# Patient Record
Sex: Male | Born: 2000 | Race: White | Hispanic: No | Marital: Single | Smoking: Never smoker
Health system: Southern US, Community
[De-identification: ages and names within clinical notes are randomized; demographics above are authoritative.]

---
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
## 2019-03-21 DIAGNOSIS — Z79899 Other long term (current) drug therapy: Secondary | ICD-10-CM | POA: Diagnosis not present

## 2019-03-21 DIAGNOSIS — Z713 Dietary counseling and surveillance: Secondary | ICD-10-CM | POA: Diagnosis not present

## 2019-03-21 DIAGNOSIS — Z0000 Encounter for general adult medical examination without abnormal findings: Secondary | ICD-10-CM | POA: Diagnosis not present

## 2019-03-21 DIAGNOSIS — Z7182 Exercise counseling: Secondary | ICD-10-CM | POA: Diagnosis not present

## 2019-03-21 DIAGNOSIS — F909 Attention-deficit hyperactivity disorder, unspecified type: Secondary | ICD-10-CM | POA: Diagnosis not present

## 2019-03-21 DIAGNOSIS — Z68.41 Body mass index (BMI) pediatric, 5th percentile to less than 85th percentile for age: Secondary | ICD-10-CM | POA: Diagnosis not present

## 2019-06-05 DIAGNOSIS — S9031XA Contusion of right foot, initial encounter: Secondary | ICD-10-CM | POA: Diagnosis not present

## 2019-09-19 DIAGNOSIS — Z79899 Other long term (current) drug therapy: Secondary | ICD-10-CM | POA: Diagnosis not present

## 2019-09-19 DIAGNOSIS — F988 Other specified behavioral and emotional disorders with onset usually occurring in childhood and adolescence: Secondary | ICD-10-CM | POA: Diagnosis not present

## 2021-09-22 ENCOUNTER — Other Ambulatory Visit: Payer: Self-pay

## 2021-09-22 ENCOUNTER — Ambulatory Visit
Admission: EM | Admit: 2021-09-22 | Discharge: 2021-09-22 | Disposition: A | Discharge status: Home / Self Care | Payer: Federal, State, Local not specified - PPO | Attending: Family Medicine | Admitting: Family Medicine

## 2021-09-22 DIAGNOSIS — S39012A Strain of muscle, fascia and tendon of lower back, initial encounter: Secondary | ICD-10-CM

## 2021-09-22 DIAGNOSIS — T148XXA Other injury of unspecified body region, initial encounter: Secondary | ICD-10-CM

## 2021-09-22 DIAGNOSIS — M25562 Pain in left knee: Secondary | ICD-10-CM

## 2021-09-22 MED ORDER — CYCLOBENZAPRINE HCL 10 MG PO TABS: 5.0000 mg | ORAL_TABLET | Freq: Three times a day (TID) | ORAL | 0 refills | Status: AC | PRN

## 2021-09-22 MED ORDER — NAPROXEN 500 MG PO TABS: 500.0000 mg | ORAL_TABLET | Freq: Two times a day (BID) | ORAL | 0 refills | Status: AC | PRN
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
## 2022-06-30 ENCOUNTER — Ambulatory Visit (INDEPENDENT_AMBULATORY_CARE_PROVIDER_SITE_OTHER): Payer: 59 | Admitting: Orthopedic Surgery

## 2022-06-30 ENCOUNTER — Ambulatory Visit (INDEPENDENT_AMBULATORY_CARE_PROVIDER_SITE_OTHER): Payer: 59

## 2022-06-30 DIAGNOSIS — M25531 Pain in right wrist: Secondary | ICD-10-CM

## 2022-07-01 ENCOUNTER — Encounter: Payer: Self-pay | Admitting: Orthopedic Surgery

## 2022-07-01 NOTE — Progress Notes (Signed)
   Office Visit Note   Patient: Patrick Smith           Date of Birth: 2000/12/17           MRN: [id number] Visit Date: 06/30/2022 Requested by: No referring provider defined for this encounter. PCP: Patient, No Pcp Per  Subjective: Chief Complaint  Patient presents with   Right Wrist - Pain    HPI: [name] is a 21 y.o. male who presents to the office reporting right wrist pain.  He was playing softball last night when he slid into second base.  He is right-hand dominant.  He describes primarily dorsal pain around the radiocarpal joint.  Hard for him to turn the key this morning.  Taking some over-the-counter medication.  Works as a Dealer..                ROS: All systems reviewed are negative as they relate to the chief complaint within the history of present illness.  Patient denies fevers or chills.  Assessment & Plan: Visit Diagnoses:  1. Pain in right wrist     Plan: Impression is right wrist pain with no acute fracture on radiographs.  No real snuffbox tenderness.  Looks like a wrist sprain but I think would be good to use a wrist splint for 2 weeks and then if his hand remains symptomatic particularly the dorsal aspect of that wrist we could consider repeat imaging and/or further MRI scanning for further evaluation.  Follow-Up Instructions: No follow-ups on file.   Orders:  Orders Placed This Encounter  Procedures   XR Wrist Complete Right   No orders of the defined types were placed in this encounter.     Procedures: No procedures performed   Clinical Data: No additional findings.  Objective: Vital Signs: There were no vitals taken for this visit.  Physical Exam:  Constitutional: Patient appears well-developed HEENT:  Head: Normocephalic Eyes:EOM are normal Neck: Normal range of motion Cardiovascular: Normal rate Pulmonary/chest: Effort normal Neurologic: Patient is alert Skin: Skin is warm Psychiatric: Patient has normal mood and affect  Ortho  Exam: Ortho exam demonstrates full active and passive range of motion of of the wrist but some pain with wrist dorsiflexion and palmar flexion compared to the left-hand side.  No effusion in the wrist is present.  EPL FPL interosseous function intact.  Radial pulse intact.  No masses lymphadenopathy or skin changes noted in that right wrist region.  No discrete radial styloid tenderness.  No TFCC tenderness.  No tendon subluxation with pronation supination.  Specialty Comments:  No specialty comments available.  Imaging: No results found.   PMFS History: There are no problems to display for this patient.  No past medical history on file.  Family History  Problem Relation Age of Onset   Healthy Mother    Healthy Father     No past surgical history on file. Social History   Occupational History   Not on file  Tobacco Use   Smoking status: Never    Passive exposure: Never   Smokeless tobacco: Never  Vaping Use   Vaping Use: Never used  Substance and Sexual Activity   Alcohol use: No   Drug use: Never   Sexual activity: Not Currently

## 2023-11-26 ENCOUNTER — Ambulatory Visit
Admission: EM | Admit: 2023-11-26 | Discharge: 2023-11-26 | Disposition: A | Discharge status: Home / Self Care | Payer: BC Managed Care – PPO | Attending: Nurse Practitioner | Admitting: Nurse Practitioner

## 2023-11-26 DIAGNOSIS — R6889 Other general symptoms and signs: Secondary | ICD-10-CM | POA: Diagnosis not present

## 2023-11-26 LAB — POC COVID19/FLU A&B COMBO
Covid Antigen, POC: NEGATIVE
Influenza A Antigen, POC: NEGATIVE
Influenza B Antigen, POC: NEGATIVE

## 2023-11-26 MED ORDER — BENZONATATE 100 MG PO CAPS: 100.0000 mg | ORAL_CAPSULE | Freq: Three times a day (TID) | ORAL | 0 refills | Status: AC | PRN

## 2023-11-26 NOTE — Discharge Instructions (Signed)
 You have a viral upper respiratory infection.  Symptoms should improve over the next week to 10 days.  If you develop chest pain or shortness of breath, go to the emergency room.  Symptoms are consistent with influenza today, however your testing is negative for COVID-19 and influenza.  Some things that can make you feel better are: - Increased rest - Increasing fluid with water/sugar free electrolytes - Acetaminophen and ibuprofen as needed for fever/pain - Salt water gargling, chloraseptic spray and throat lozenges for sore throat - OTC guaifenesin (Mucinex) 600 mg twice daily for congestion - Saline sinus flushes or a neti pot - Humidifying the air -Tessalon Perles every 8 hours as needed for dry cough

## 2023-11-26 NOTE — ED Provider Notes (Signed)
 [facility]    CSN: [id number] Arrival date & time: 11/26/23  1225      History   Chief Complaint No chief complaint on file.   HPI Patrick Smith is a 22 y.o. male.   Patient presents today with 1 day history of chills, dry cough, runny and stuffy nose, sore throat from coughing, headache, nausea without vomiting, decreased appetite, fatigue, and weakness.  No fevers or bodyaches, shortness of breath or chest pain, ear pain, abdominal pain, vomiting, or diarrhea.  He has been around multiple coworkers who have tested positive for influenza.  Has been taking NyQuil and ibuprofen for symptoms with minimal temporary improvement.    History reviewed. No pertinent past medical history.  There are no active problems to display for this patient.   History reviewed. No pertinent surgical history.     Home Medications    Prior to Admission medications   Medication Sig Start Date End Date Taking? Authorizing Provider  benzonatate (TESSALON) 100 MG capsule Take 1 capsule (100 mg total) by mouth 3 (three) times daily as needed for cough. Do not take with alcohol or while operating or driving heavy machinery 4/40/10  Yes Cathlean Marseilles A, NP  cyclobenzaprine (FLEXERIL) 10 MG tablet Take 0.5-1 tablets (5-10 mg total) by mouth 3 (three) times daily as needed for muscle spasms. Do not drink alcohol or drive while taking this medication.  May cause drowsiness. 09/22/21   Particia Nearing, PA-C  methylphenidate 54 MG PO CR tablet Take 54 mg by mouth every morning.    [provider]  naproxen (NAPROSYN) 500 MG tablet Take 1 tablet (500 mg total) by mouth 2 (two) times daily as needed. 09/22/21   Particia Nearing, PA-C    Family History Family History  Problem Relation Age of Onset   Healthy Mother    Healthy Father     Social History Social History   Tobacco Use   Smoking status: Never    Passive exposure: Never   Smokeless tobacco: Never  Vaping  Use   Vaping status: Never Used  Substance Use Topics   Alcohol use: No   Drug use: Never     Allergies   Patient has no known allergies.   Review of Systems Review of Systems Per HPI  Physical Exam Triage Vital Signs ED Triage Vitals  Encounter Vitals Group     BP 11/26/23 1358 119/72     Systolic BP Percentile --      Diastolic BP Percentile --      Pulse Rate 11/26/23 1358 71     Resp 11/26/23 1358 16     Temp 11/26/23 1358 98.6 F (37 C)     Temp Source 11/26/23 1358 Oral     SpO2 11/26/23 1358 96 %     Weight --      Height --      Head Circumference --      Peak Flow --      Pain Score 11/26/23 1400 0     Pain Loc --      Pain Education --      Exclude from Growth Chart --    No data found.  Updated Vital Signs BP 119/72 (BP Location: Right Arm)   Pulse 71   Temp 98.6 F (37 C) (Oral)   Resp 16   SpO2 96%   Visual Acuity Right Eye Distance:   Left Eye Distance:   Bilateral Distance:    Right  Eye Near:   Left Eye Near:    Bilateral Near:     Physical Exam Vitals and nursing note reviewed.  Constitutional:      General: He is not in acute distress.    Appearance: Normal appearance. He is not ill-appearing or toxic-appearing.  HENT:     Head: Normocephalic and atraumatic.     Right Ear: Tympanic membrane, ear canal and external ear normal.     Left Ear: Tympanic membrane, ear canal and external ear normal.     Nose: No congestion or rhinorrhea.     Mouth/Throat:     Mouth: Mucous membranes are moist.     Pharynx: Oropharynx is clear. No oropharyngeal exudate or posterior oropharyngeal erythema.  Eyes:     General: No scleral icterus.    Extraocular Movements: Extraocular movements intact.  Cardiovascular:     Rate and Rhythm: Normal rate and regular rhythm.  Pulmonary:     Effort: Pulmonary effort is normal. No respiratory distress.     Breath sounds: Normal breath sounds. No wheezing, rhonchi or rales.  Musculoskeletal:     Cervical  back: Normal range of motion and neck supple.  Lymphadenopathy:     Cervical: No cervical adenopathy.  Skin:    General: Skin is warm and dry.     Coloration: Skin is not jaundiced or pale.     Findings: No erythema or rash.  Neurological:     Mental Status: He is alert and oriented to person, place, and time.  Psychiatric:        Behavior: Behavior is cooperative.      UC Treatments / Results  Labs (all labs ordered are listed, but only abnormal results are displayed) Labs Reviewed  POC COVID19/FLU A&B COMBO - Normal    EKG   Radiology No results found.  Procedures Procedures (including critical care time)  Medications Ordered in UC Medications - No data to display  Initial Impression / Assessment and Plan / UC Course  I have reviewed the triage vital signs and the nursing notes.  Pertinent labs & imaging results that were available during my care of the patient were reviewed by me and considered in my medical decision making (see chart for details).   Patient is well-appearing, normotensive, afebrile, not tachycardic, not tachypneic, oxygenating well on room air.    1. Flu-like symptoms Vitals and exam are reassuring today Suspect viral etiology COVID-19, influenza testing is negative We discussed that symptoms are consistent with influenza and I discussed supportive care including continuing NyQuil/DayQuil/ibuprofen as needed I sent cough suppressant medication to the pharmacy Return and ER precautions discussed Work excuse provided  The patient was given the opportunity to ask questions.  All questions answered to their satisfaction.  The patient is in agreement to this plan.    Final Clinical Impressions(s) / UC Diagnoses   Final diagnoses:  Flu-like symptoms     Discharge Instructions      You have a viral upper respiratory infection.  Symptoms should improve over the next week to 10 days.  If you develop chest pain or shortness of breath, go to the  emergency room.  Symptoms are consistent with influenza today, however your testing is negative for COVID-19 and influenza.  Some things that can make you feel better are: - Increased rest - Increasing fluid with water/sugar free electrolytes - Acetaminophen and ibuprofen as needed for fever/pain - Salt water gargling, chloraseptic spray and throat lozenges for sore throat - OTC guaifenesin (  Mucinex) 600 mg twice daily for congestion - Saline sinus flushes or a neti pot - Humidifying the air -Tessalon Perles every 8 hours as needed for dry cough      ED Prescriptions     Medication Sig Dispense Auth. Provider   benzonatate (TESSALON) 100 MG capsule Take 1 capsule (100 mg total) by mouth 3 (three) times daily as needed for cough. Do not take with alcohol or while operating or driving heavy machinery 21 capsule Valentino Nose, NP      PDMP not reviewed this encounter.   Valentino Nose, NP 11/26/23 [phone]

## 2023-11-26 NOTE — ED Triage Notes (Signed)
 Pt reports cough,congestion, headache weakness and fatigue x 1 day.

## 2024-05-22 ENCOUNTER — Ambulatory Visit
Admission: EM | Admit: 2024-05-22 | Discharge: 2024-05-22 | Disposition: A | Discharge status: Home / Self Care | Attending: Family Medicine | Admitting: Family Medicine

## 2024-05-22 DIAGNOSIS — L237 Allergic contact dermatitis due to plants, except food: Secondary | ICD-10-CM

## 2024-05-22 MED ORDER — METHYLPREDNISOLONE ACETATE 80 MG/ML IJ SUSP
80.0000 mg | Freq: Once | INTRAMUSCULAR | Status: AC
  Administered 2024-05-22: 80 mg via INTRAMUSCULAR

## 2024-05-22 MED ORDER — TRIAMCINOLONE ACETONIDE 0.1 % EX CREA: 1.0000 | TOPICAL_CREAM | Freq: Two times a day (BID) | CUTANEOUS | 0 refills | Status: AC

## 2024-05-22 NOTE — Discharge Instructions (Signed)
 As discussed, you may use the hydrocortisone cream over-the-counter for the spots on your face and the triamcinolone  cream that has been prescribed for other body parts for topical treatment.  We have given you a steroid shot today that should help stop the spread and resolve the rash

## 2024-05-22 NOTE — ED Provider Notes (Signed)
 [facility]    CSN: [id number] Arrival date & time: 05/22/24  9180      History   Chief Complaint No chief complaint on file.   HPI Patrick Smith is a 23 y.o. male.   Patient presenting today with itchy rash to bilateral arms now spreading to chest and face times for 5 days.  Started after weed eating in the woods.  Denies throat itching or swelling, chest tightness, shortness of breath, abdominal pain, nausea vomiting or diarrhea.  No new foods or medications, other new exposures noted.  Trying hydrocortisone cream with no relief.    History reviewed. No pertinent past medical history.  There are no active problems to display for this patient.   History reviewed. No pertinent surgical history.     Home Medications    Prior to Admission medications   Medication Sig Start Date End Date Taking? Authorizing Provider  triamcinolone  cream (KENALOG ) 0.1 % Apply 1 Application topically 2 (two) times daily. Avoid use on face or private areas 05/22/24  Yes Stuart Vernell Norris, PA-C  benzonatate  (TESSALON ) 100 MG capsule Take 1 capsule (100 mg total) by mouth 3 (three) times daily as needed for cough. Do not take with alcohol or while operating or driving heavy machinery 7/75/74   Chandra Harlene LABOR, NP  cyclobenzaprine  (FLEXERIL ) 10 MG tablet Take 0.5-1 tablets (5-10 mg total) by mouth 3 (three) times daily as needed for muscle spasms. Do not drink alcohol or drive while taking this medication.  May cause drowsiness. 09/22/21   Stuart Vernell Norris, PA-C  methylphenidate 54 MG PO CR tablet Take 54 mg by mouth every morning.    [provider]  naproxen  (NAPROSYN ) 500 MG tablet Take 1 tablet (500 mg total) by mouth 2 (two) times daily as needed. 09/22/21   Stuart Vernell Norris, PA-C    Family History Family History  Problem Relation Age of Onset   Healthy Mother    Healthy Father     Social History Social History   Tobacco Use   Smoking status:  Never    Passive exposure: Never   Smokeless tobacco: Never  Vaping Use   Vaping status: Never Used  Substance Use Topics   Alcohol use: No   Drug use: Never     Allergies   Patient has no known allergies.   Review of Systems Review of Systems Per HPI  Physical Exam Triage Vital Signs ED Triage Vitals  Encounter Vitals Group     BP 05/22/24 0828 (!) 141/78     Girls Systolic BP Percentile --      Girls Diastolic BP Percentile --      Boys Systolic BP Percentile --      Boys Diastolic BP Percentile --      Pulse Rate 05/22/24 0828 (!) 59     Resp 05/22/24 0828 18     Temp 05/22/24 0828 97.6 F (36.4 C)     Temp Source 05/22/24 0828 Oral     SpO2 05/22/24 0828 98 %     Weight --      Height --      Head Circumference --      Peak Flow --      Pain Score 05/22/24 0830 0     Pain Loc --      Pain Education --      Exclude from Growth Chart --    No data found.  Updated Vital Signs BP (!) 141/78 (BP Location:  Right Arm)   Pulse (!) 59   Temp 97.6 F (36.4 C) (Oral)   Resp 18   SpO2 98%   Visual Acuity Right Eye Distance:   Left Eye Distance:   Bilateral Distance:    Right Eye Near:   Left Eye Near:    Bilateral Near:     Physical Exam Vitals and nursing note reviewed.  Constitutional:      Appearance: Normal appearance.  HENT:     Head: Atraumatic.  Eyes:     Extraocular Movements: Extraocular movements intact.     Conjunctiva/sclera: Conjunctivae normal.  Cardiovascular:     Rate and Rhythm: Normal rate.  Pulmonary:     Effort: Pulmonary effort is normal.     Breath sounds: Normal breath sounds.  Musculoskeletal:        General: Normal range of motion.     Cervical back: Normal range of motion and neck supple.  Skin:    General: Skin is warm and dry.     Findings: Rash present.     Comments: Erythematous maculopapular linear rash to bilateral arms, chest, left side of forehead and right posterior neck.  Neurological:     Mental Status:  He is oriented to person, place, and time.  Psychiatric:        Mood and Affect: Mood normal.        Thought Content: Thought content normal.        Judgment: Judgment normal.      UC Treatments / Results  Labs (all labs ordered are listed, but only abnormal results are displayed) Labs Reviewed - No data to display  EKG   Radiology No results found.  Procedures Procedures (including critical care time)  Medications Ordered in UC Medications  methylPREDNISolone  acetate (DEPO-MEDROL ) injection 80 mg (80 mg Intramuscular Given 05/22/24 0853)    Initial Impression / Assessment and Plan / UC Course  I have reviewed the triage vital signs and the nursing notes.  Pertinent labs & imaging results that were available during my care of the patient were reviewed by me and considered in my medical decision making (see chart for details).     Consistent with poison ivy dermatitis.  Treat with Depo-Medrol  IM, triamcinolone  cream, supportive over-the-counter medications and home care.  Return for worsening symptoms.  Final Clinical Impressions(s) / UC Diagnoses   Final diagnoses:  Poison ivy dermatitis     Discharge Instructions      As discussed, you may use the hydrocortisone cream over-the-counter for the spots on your face and the triamcinolone  cream that has been prescribed for other body parts for topical treatment.  We have given you a steroid shot today that should help stop the spread and resolve the rash    ED Prescriptions     Medication Sig Dispense Auth. Provider   triamcinolone  cream (KENALOG ) 0.1 % Apply 1 Application topically 2 (two) times daily. Avoid use on face or private areas 80 g Stuart Vernell Norris, [place]      PDMP not reviewed this encounter.   Stuart Vernell Norris, [place] 05/22/24 1010

## 2024-05-22 NOTE — ED Triage Notes (Signed)
 Pt reports poison ivy on both arms and is now on his chest as well this occurred after weed eating on Sunday, has been itching non stop. Pt has not tried OTC remedies

## 2024-06-25 ENCOUNTER — Ambulatory Visit (HOSPITAL_COMMUNITY): Attending: Physician Assistant

## 2024-06-25 ENCOUNTER — Encounter (HOSPITAL_COMMUNITY): Payer: Self-pay

## 2024-06-25 ENCOUNTER — Other Ambulatory Visit: Payer: Self-pay

## 2024-06-25 DIAGNOSIS — M79605 Pain in left leg: Secondary | ICD-10-CM | POA: Insufficient documentation

## 2024-06-25 DIAGNOSIS — M5459 Other low back pain: Secondary | ICD-10-CM | POA: Diagnosis present

## 2024-06-25 DIAGNOSIS — M545 Low back pain, unspecified: Secondary | ICD-10-CM | POA: Insufficient documentation

## 2024-06-25 DIAGNOSIS — M79604 Pain in right leg: Secondary | ICD-10-CM | POA: Insufficient documentation

## 2024-06-25 DIAGNOSIS — R293 Abnormal posture: Secondary | ICD-10-CM | POA: Insufficient documentation

## 2024-06-25 DIAGNOSIS — R29898 Other symptoms and signs involving the musculoskeletal system: Secondary | ICD-10-CM | POA: Insufficient documentation

## 2024-06-25 NOTE — Therapy (Unsigned)
 OUTPATIENT PHYSICAL THERAPY THORACOLUMBAR EVALUATION   [name] MRN: [id number] DOB:Oct 15, 2000, 23 y.o., male Today's Date: 06/26/2024  END OF SESSION:  PT End of Session - 06/25/24 1548     Visit Number 1    Number of Visits 10    Date for Recertification  07/23/24    Authorization Type [employer]BS COMM PPO    Authorization Time Period Auth requested    Progress Note Due on Visit 10    PT Start Time 1550    PT Stop Time 1630    PT Time Calculation (min) 40 min    Activity Tolerance Patient tolerated treatment well    Behavior During Therapy [facility] for tasks assessed/performed          History reviewed. No pertinent past medical history. History reviewed. No pertinent surgical history. There are no active problems to display for this patient.   PCP: Chinita Hoy LITTIE DEVONNA  REFERRING PROVIDER: Donata Snowman, PA-C  REFERRING DIAG: M54.50 (ICD-10-CM) - Low back pain, unspecified M79.605 (ICD-10-CM) - Pain in left leg M62.9 (ICD-10-CM) - Disorder of muscle, unspecified  Rationale for Evaluation and Treatment: Rehabilitation  THERAPY DIAG:  Other low back pain  Leg weakness, bilateral  Abnormal posture  Low back pain radiating to both legs  ONSET DATE: ~5 Months   SUBJECTIVE:                                                                                                                                                                                           SUBJECTIVE STATEMENT: Patient reports this last Sunday was really bad as far as pain; To the point where he was barely able to walk. Reports his pain is usually from low back and travels to posterior hip near glute region and can sometimes travel to back of knees, on both sides. As of recent, not much back pain as much as it is hip/LE pain.    PERTINENT HISTORY:  Previous knee issues due to running cross country  PAIN:  Are you having pain? Yes: NPRS scale: 4-5/10 Pain location: Glute/hips  bilaterally  Pain description: sharp pain  Aggravating factors: Walking, climbing on machines at work, sitting prolonged  Relieving factors: Iburpofen, advil   PRECAUTIONS: None  RED FLAGS: None   WEIGHT BEARING RESTRICTIONS: No  FALLS:  Has patient fallen in last 6 months? Yes. Number of falls 1. 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  DIAGNOSTIC FINDINGS:  N/A  PATIENT SURVEYS:  Lower Extremity Functional Score: 59 / 80 = 73.8 %   COGNITION: Overall cognitive status: Within functional limits for tasks assessed     SENSATION: WFL  MUSCLE LENGTH: Hamstrings: Right 140 deg; Left 140 deg Tight piriformis bilaterally when put in figure 4 position  POSTURE: rounded shoulders, forward head, decreased lumbar lordosis, and increased thoracic kyphosis  PALPATION: TTP in L3-4 with CPA Tight paraspinals throughout  LUMBAR ROM:   AROM eval  Flexion Mid shin, * down BLE  Extension  50% *  Right lateral flexion To knee  Left lateral flexion To knee  Right rotation WFL *  Left rotation WFL    (Blank rows = not tested)    *=painful   LOWER EXTREMITY ROM:     Active  Right eval Left eval  Hip flexion    Hip extension    Hip abduction    Hip adduction    Hip internal rotation    Hip external rotation    Knee flexion    Knee extension    Ankle dorsiflexion    Ankle plantarflexion    Ankle inversion    Ankle eversion     (Blank rows = not tested)  LOWER EXTREMITY MMT:    MMT Right eval Left eval  Hip flexion 4+ 4+  Hip  extension 4- 4  Hip abduction 4- 4  Hip adduction    Hip internal rotation    Hip external rotation    Knee flexion    Knee extension    Ankle dorsiflexion 5 5  Ankle plantarflexion    Ankle inversion    Ankle eversion     (Blank rows = not tested)  LUMBAR SPECIAL TESTS:  Straight leg raise test: Positive   FUNCTIONAL TESTS:  30 seconds chair stand test: 11 STS, no UE support, inc pain started in RLE towards end  GAIT: Distance walked: 100 ft in session  Assistive device utilized: None Level of assistance: Complete Independence Comments: WNL at time of eval  TREATMENT DATE:  06/25/24: PT Eval and HEP                                                                                                                                 PATIENT EDUCATION:  Education details: PT evaluation, objective findings, POC, Importance of HEP, Precautions, Clinic policies Person educated: Patient Education method: Explanation and Demonstration Education comprehension: verbalized understanding and returned demonstration  HOME EXERCISE PROGRAM: Access Code: [email or web address] URL: [email or web address] Date: 06/25/2024 Prepared by: Rosaria Powell-Butler  Exercises - Hooklying Hamstring Stretch with Strap  - 2 x daily - 7 x weekly -  2 sets - 30 hold - Seated Hamstring Stretch  - 2 x daily - 7 x weekly - 2 sets - 30 hold - Supine Piriformis Stretch with Foot on Ground  - 2 x daily - 7 x weekly - 2 sets - 30 hold - Seated Piriformis Stretch with Trunk Bend  - 2 x daily - 7 x weekly - 2 sets - 30 hold - Lower Trunk Rotations  - 2 x daily - 7 x weekly - 2 sets - 10 reps -Utilization of lumbar support while driving (print off given)   ASSESSMENT:  CLINICAL IMPRESSION: Patient is a 23 y.o. male who was seen today for physical therapy evaluation and treatment for M54.50 (ICD-10-CM) - Low back pain, unspecified M79.605 (ICD-10-CM) - Pain in left leg M62.9 (ICD-10-CM) - Disorder of muscle,  unspecified. On this date, patient demonstrates impaired self perception of function, decreased LE strength, decreased lumbar ROM, tight piriformis, poor posture, hamstring, paraspinal musculature, and pain with palpation to lumbar spine all of which may be contributing factors to patient increased and radiating pain. Patient reports prolonged sitting as a part of his job duties. Educated and gave print off example of lumbar support for better posture of spine to help with decreasing pain. Trial of sitting with lumbar support in session with patient reporting relief to low back and hip pain but increased discomfort between shoulder blades. Educated this can be due to muscle imbalances. Patient will benefit from continued skilled physical therapy in order to address the above/below deficits in order to reduce pain and improve function/quality of life.    OBJECTIVE IMPAIRMENTS: decreased activity tolerance, decreased endurance, decreased mobility, decreased ROM, decreased strength, hypomobility, impaired perceived functional ability, impaired flexibility, improper body mechanics, postural dysfunction, and pain.   ACTIVITY LIMITATIONS: carrying, lifting, bending, sitting, standing, squatting, stairs, transfers, and bed mobility  PARTICIPATION LIMITATIONS: meal prep, cleaning, laundry, driving, community activity, and occupation  PERSONAL FACTORS: N/A are also affecting patient's functional outcome.   REHAB POTENTIAL: Good  CLINICAL DECISION MAKING: Stable/uncomplicated  EVALUATION COMPLEXITY: Low   GOALS: Goals reviewed with patient? No  SHORT TERM GOALS: Target date: 07/16/24 Patient will be independent with performance of HEP to demonstrate adequate self management of symptoms.  Baseline:  Goal status: INITIAL  2.   Patient will report at least a 25% improvement with function and/or pain reduction overall since beginning PT. Baseline:  Goal status: INITIAL   LONG TERM GOALS: Target  date: 08/20/24 Patient will improve LEFS score by 9 points to demonstrate improved perceived function while meeting MCID.  Baseline: Goal status: INITIAL 2.  Patient will improve  hamstring  ROM by at least 10 degrees to demonstrate improved LE flexibility/mobility needed for functional transfers and gait mechanics to reduce pain.  Baseline:  Goal status: INITIAL 3.  Patient will score at least a  4/5 on  RLE hip ext/abd MMT to demonstrate increased LE strength and/or power needed to improve ambulation/gait mechanics.  Baseline:  Goal status: INITIAL   4.  Patient will improve 30 second STS test by at least 3 STS with decreased pain rating (3/10) or less to demonstrate improved activity tolerance.  Baseline: Goal status: INITIAL 5.   Patient will report at least a 75% improvement with function and/or radiating pain reduction overall since beginning PT. Baseline:  Goal status: INITIAL  PLAN:  PT FREQUENCY: 1x/week  PT DURATION: 8 weeks  PLANNED INTERVENTIONS: 97164- PT Re-evaluation, 97110-Therapeutic exercises, 97530- Therapeutic activity, [id number]- Neuromuscular re-education, 97535- Self  Care, 02859- Manual therapy, [id number]- Gait training, [phone]- Electrical stimulation (manual), [id number]- Traction (mechanical), [phone] (1-2 muscles), 20561 (3+ muscles)- Dry Needling, Patient/Family education, Balance training, Stair training, Taping, Joint mobilization, Spinal mobilization, Cryotherapy, and Moist heat.  PLAN FOR NEXT SESSION: Review HEP and goals, cont improving LE flexibility, lumbar mobility, address posture   2:03 PM, 06/26/24 Tauheed Mcfayden Powell-Butler, PT, DPT [facility]    Managed Medicaid Authorization Request Treatment Start Date: 2024/07/01  Visit Dx Codes:  M54.59 M54.50 M79.604 M79.605 R29.898 R29.3  Functional Tool Score:  Lower Extremity Functional Score: 59 / 80 = 73.8 % 30 seconds chair stand test  For all possible CPT codes, reference the  Planned Interventions line above.     Check all conditions that are expected to impact treatment: {Conditions expected to impact treatment:None of these apply   If treatment provided at initial evaluation, no treatment charged due to lack of authorization.

## 2024-07-03 ENCOUNTER — Encounter (HOSPITAL_COMMUNITY): Payer: Self-pay

## 2024-07-03 ENCOUNTER — Ambulatory Visit (HOSPITAL_COMMUNITY): Attending: Physician Assistant

## 2024-07-03 DIAGNOSIS — M545 Low back pain, unspecified: Secondary | ICD-10-CM | POA: Diagnosis present

## 2024-07-03 DIAGNOSIS — M5459 Other low back pain: Secondary | ICD-10-CM | POA: Insufficient documentation

## 2024-07-03 DIAGNOSIS — M79604 Pain in right leg: Secondary | ICD-10-CM | POA: Insufficient documentation

## 2024-07-03 DIAGNOSIS — R293 Abnormal posture: Secondary | ICD-10-CM | POA: Diagnosis present

## 2024-07-03 DIAGNOSIS — M79605 Pain in left leg: Secondary | ICD-10-CM | POA: Diagnosis present

## 2024-07-03 DIAGNOSIS — R29898 Other symptoms and signs involving the musculoskeletal system: Secondary | ICD-10-CM | POA: Diagnosis present

## 2024-07-03 NOTE — Therapy (Signed)
 OUTPATIENT PHYSICAL THERAPY THORACOLUMBAR TREATMENT   [name] MRN: [id number] DOB:04/27/2001, 23 y.o., male Today's Date: 07/03/2024  END OF SESSION:  PT End of Session - 07/03/24 1502     Visit Number 2    Number of Visits 10    Date for Recertification  07/23/24    Authorization Type BCBS COMM PPO    Authorization Time Period no auth req    Progress Note Due on Visit 10    PT Start Time 1502    PT Stop Time 1540    PT Time Calculation (min) 38 min    Activity Tolerance Patient tolerated treatment well    Behavior During Therapy WFL for tasks assessed/performed           History reviewed. No pertinent past medical history. History reviewed. No pertinent surgical history. There are no active problems to display for this patient.   PCP: Chinita Hoy LITTIE DEVONNA  REFERRING PROVIDER: Donata Snowman, PA-C  REFERRING DIAG: M54.50 (ICD-10-CM) - Low back pain, unspecified M79.605 (ICD-10-CM) - Pain in left leg M62.9 (ICD-10-CM) - Disorder of muscle, unspecified  Rationale for Evaluation and Treatment: Rehabilitation  THERAPY DIAG:  Other low back pain  Leg weakness, bilateral  Abnormal posture  Low back pain radiating to both legs  ONSET DATE: ~5 Months   SUBJECTIVE:                                                                                                                                                                                           SUBJECTIVE STATEMENT: Pt states pain is mainly in the proximal LE rather than the back. Pt reports pain of 2-3/10. Pt states morning and the night time are the worst.  Eval: Patient reports this last Sunday was really bad as far as pain; To the point where he was barely able to walk. Reports his pain is usually from low back and travels to posterior hip near glute region and can sometimes travel to back of knees, on both sides. As of recent, not much back pain as much as it is hip/LE pain.     PERTINENT HISTORY:  Previous knee issues due to running cross country  PAIN:  Are you having pain? Yes: NPRS scale: 4-5/10 Pain location: Glute/hips bilaterally  Pain description: sharp pain  Aggravating factors: Walking, climbing on machines at work, sitting prolonged  Relieving factors: Iburpofen, advil   PRECAUTIONS: None  RED FLAGS: None   WEIGHT BEARING RESTRICTIONS: No  FALLS:  Has patient fallen in last 6 months? Yes. Number of falls 1. 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  DIAGNOSTIC FINDINGS:  N/A  PATIENT SURVEYS:  Lower Extremity Functional Score: 59 / 80 = 73.8 %   COGNITION: Overall cognitive status: Within functional limits for tasks assessed     SENSATION: WFL  MUSCLE LENGTH: Hamstrings: Right 140 deg; Left 140 deg Tight piriformis bilaterally when put in figure 4 position  POSTURE: rounded shoulders, forward head, decreased lumbar lordosis, and increased thoracic kyphosis  PALPATION: TTP in L3-4 with CPA Tight paraspinals throughout  LUMBAR ROM:   AROM eval  Flexion Mid shin, * down BLE  Extension  50% *  Right lateral flexion To knee  Left lateral flexion To knee  Right rotation WFL *  Left rotation WFL    (Blank rows = not tested)    *=painful   LOWER EXTREMITY ROM:     Active  Right eval Left eval  Hip flexion    Hip extension    Hip abduction    Hip adduction    Hip internal rotation    Hip external rotation    Knee flexion    Knee extension    Ankle dorsiflexion    Ankle  plantarflexion    Ankle inversion    Ankle eversion     (Blank rows = not tested)  LOWER EXTREMITY MMT:    MMT Right eval Left eval  Hip flexion 4+ 4+  Hip extension 4- 4  Hip abduction 4- 4  Hip adduction    Hip internal rotation    Hip external rotation    Knee flexion    Knee extension    Ankle dorsiflexion 5 5  Ankle plantarflexion    Ankle inversion    Ankle eversion     (Blank rows = not tested)  LUMBAR SPECIAL TESTS:  Straight leg raise test: Positive   FUNCTIONAL TESTS:  30 seconds chair stand test: 11 STS, no UE support, inc pain started in RLE towards end  GAIT: Distance walked: 100 ft in session  Assistive device utilized: None Level of assistance: Complete Independence Comments: WNL at time of eval  TREATMENT DATE:  07/03/2024  Therapeutic Exercise: -Treadmill, 5 minutes, 2.0 grade, speed 1.6>2.2>2.8, pt cued for pain free speed -DKTC on green exercise ball, 2 sets of 10 reps, pt cued for pain free ROM -LTR on green exercise ball, 2 sets of 10 reps, pt cued for max ROM -Supine bridges 2 sets of 8 reps, 3 second holds, symptomatic, pt cued for max hip extension, second set with GTB at knees Neuromuscular Re-education: -Bird dogs, 2 sets of 5 reps, pt cued for core/lumbar activation -Side planks, 2 reps of 30 second holds, pt cued for LE placement -Modified crunch, 1 sets of 10 reps, 5 second hold, pt cued for LLE up for decreased symptoms -Lateral stepping, GTB at ankles, 1 laps 10 steps per lap, pt cued for upright posture and core activation -Monster walks on 20 foot line, 2 laps, GTB at ankles, pt cued for core activation    06/25/24: PT Eval and HEP  PATIENT EDUCATION:  Education details: PT evaluation, objective findings, POC, Importance of HEP, Precautions, Clinic policies Person educated: Patient Education method:  Explanation and Demonstration Education comprehension: verbalized understanding and returned demonstration  HOME EXERCISE PROGRAM: Access Code: [email or web address] URL: [email or web address] Date: 06/25/2024 Prepared by: Rosaria Powell-Butler  Exercises - Hooklying Hamstring Stretch with Strap  - 2 x daily - 7 x weekly - 2 sets - 30 hold - Seated Hamstring Stretch  - 2 x daily - 7 x weekly - 2 sets - 30 hold - Supine Piriformis Stretch with Foot on Ground  - 2 x daily - 7 x weekly - 2 sets - 30 hold - Seated Piriformis Stretch with Trunk Bend  - 2 x daily - 7 x weekly - 2 sets - 30 hold - Lower Trunk Rotations  - 2 x daily - 7 x weekly - 2 sets - 10 reps -Utilization of lumbar support while driving (print off given)   ASSESSMENT:  CLINICAL IMPRESSION: Patient continues to demonstrate increased pain in low back and RLE, fair core/LE strength, and fair endurance. Patient also demonstrates good gait quality with aerobic based exercise during today's session with treadmill, symmetrical gait evident. Patient able to progress dynamic balance and core activation exercises today with bird dogs and supine bridge variations, good performance with verbal cueing. Patient would continue to benefit from skilled physical therapy for increased endurance with ambulation, increased LE/core strength, and improved activity tolerance for improved quality of life, improved independence with management of low back and LE pain and continued progress towards therapy goals.   Eval: Patient is a 23 y.o. male who was seen today for physical therapy evaluation and treatment for M54.50 (ICD-10-CM) - Low back pain, unspecified M79.605 (ICD-10-CM) - Pain in left leg M62.9 (ICD-10-CM) - Disorder of muscle, unspecified. On this date, patient demonstrates impaired self perception of function, decreased LE strength, decreased lumbar ROM, tight piriformis, poor posture, hamstring, paraspinal musculature, and pain with  palpation to lumbar spine all of which may be contributing factors to patient increased and radiating pain. Patient reports prolonged sitting as a part of his job duties. Educated and gave print off example of lumbar support for better posture of spine to help with decreasing pain. Trial of sitting with lumbar support in session with patient reporting relief to low back and hip pain but increased discomfort between shoulder blades. Educated this can be due to muscle imbalances. Patient will benefit from continued skilled physical therapy in order to address the above/below deficits in order to reduce pain and improve function/quality of life.    OBJECTIVE IMPAIRMENTS: decreased activity tolerance, decreased endurance, decreased mobility, decreased ROM, decreased strength, hypomobility, impaired perceived functional ability, impaired flexibility, improper body mechanics, postural dysfunction, and pain.   ACTIVITY LIMITATIONS: carrying, lifting, bending, sitting, standing, squatting, stairs, transfers, and bed mobility  PARTICIPATION LIMITATIONS: meal prep, cleaning, laundry, driving, community activity, and occupation  PERSONAL FACTORS: N/A are also affecting patient's functional outcome.   REHAB POTENTIAL: Good  CLINICAL DECISION MAKING: Stable/uncomplicated  EVALUATION COMPLEXITY: Low   GOALS: Goals reviewed with patient? No  SHORT TERM GOALS: Target date: 07/16/24 Patient will be independent with performance of HEP to demonstrate adequate self management of symptoms.  Baseline:  Goal status: INITIAL  2.   Patient will report at least a 25% improvement with function and/or pain reduction overall since beginning PT. Baseline:  Goal status: INITIAL   LONG TERM GOALS: Target date: 08/20/24 Patient will improve LEFS score by 9 points to  demonstrate improved perceived function while meeting MCID.  Baseline: Goal status: INITIAL 2.  Patient will improve  hamstring  ROM by at least 10  degrees to demonstrate improved LE flexibility/mobility needed for functional transfers and gait mechanics to reduce pain.  Baseline:  Goal status: INITIAL 3.  Patient will score at least a  4/5 on  RLE hip ext/abd MMT to demonstrate increased LE strength and/or power needed to improve ambulation/gait mechanics.  Baseline:  Goal status: INITIAL   4.  Patient will improve 30 second STS test by at least 3 STS with decreased pain rating (3/10) or less to demonstrate improved activity tolerance.  Baseline: Goal status: INITIAL 5.   Patient will report at least a 75% improvement with function and/or radiating pain reduction overall since beginning PT. Baseline:  Goal status: INITIAL  PLAN:  PT FREQUENCY: 1x/week  PT DURATION: 8 weeks  PLANNED INTERVENTIONS: 97164- PT Re-evaluation, 97110-Therapeutic exercises, 97530- Therapeutic activity, [id number]- Neuromuscular re-education, 97535- Self Care, 02859- Manual therapy, [id number]- Gait training, [phone]- Electrical stimulation (manual), [id number]- Traction (mechanical), [phone] (1-2 muscles), 20561 (3+ muscles)- Dry Needling, Patient/Family education, Balance training, Stair training, Taping, Joint mobilization, Spinal mobilization, Cryotherapy, and Moist heat.  PLAN FOR NEXT SESSION: Review HEP and goals, cont improving LE flexibility, lumbar mobility, address posture, progress endurance training   Lang Ada, PT, DPT [facility] Office: [phone] 3:50 PM, 07/03/24

## 2024-07-11 ENCOUNTER — Ambulatory Visit (HOSPITAL_COMMUNITY)

## 2024-07-11 ENCOUNTER — Encounter (HOSPITAL_COMMUNITY): Payer: Self-pay

## 2024-07-11 DIAGNOSIS — M5459 Other low back pain: Secondary | ICD-10-CM

## 2024-07-11 DIAGNOSIS — M79604 Pain in right leg: Secondary | ICD-10-CM

## 2024-07-11 DIAGNOSIS — R293 Abnormal posture: Secondary | ICD-10-CM

## 2024-07-11 DIAGNOSIS — R29898 Other symptoms and signs involving the musculoskeletal system: Secondary | ICD-10-CM

## 2024-07-11 NOTE — Therapy (Signed)
 OUTPATIENT PHYSICAL THERAPY THORACOLUMBAR TREATMENT   [name] MRN: [id number] DOB:06-25-2001, 23 y.o., male Today's Date: 07/11/2024  END OF SESSION:  PT End of Session - 07/11/24 1545     Visit Number 3    Number of Visits 10    Date for Recertification  07/23/24    Authorization Type [employer]BS COMM PPO    Authorization Time Period no auth req    Progress Note Due on Visit 10    PT Start Time 1545    PT Stop Time 1626    PT Time Calculation (min) 41 min    Activity Tolerance Patient tolerated treatment well    Behavior During Therapy WFL for tasks assessed/performed            History reviewed. No pertinent past medical history. History reviewed. No pertinent surgical history. There are no active problems to display for this patient.   PCP: Patrick Smith  REFERRING PROVIDER: Donata Snowman, PA-C  REFERRING DIAG: M54.50 (ICD-10-CM) - Low back pain, unspecified M79.605 (ICD-10-CM) - Pain in left leg M62.9 (ICD-10-CM) - Disorder of muscle, unspecified  Rationale for Evaluation and Treatment: Rehabilitation  THERAPY DIAG:  Other low back pain  Leg weakness, bilateral  Abnormal posture  Low back pain radiating to both legs  ONSET DATE: ~5 Months   SUBJECTIVE:                                                                                                                                                                                           SUBJECTIVE STATEMENT: Patient reports that he is not hurting right now. He did have some trips that caused his back to hurt.   Eval: Patient reports this last Sunday was really bad as far as pain; To the point where he was barely able to walk. Reports his pain is usually from low back and travels to posterior hip near glute region and can sometimes travel to back of knees, on both sides. As of recent, not much back pain as much as it is hip/LE pain.    PERTINENT HISTORY:  Previous knee issues  due to running cross country  PAIN:  Are you having pain? Yes: NPRS scale: 0/10 Pain location: Glute/hips bilaterally  Pain description: sharp pain  Aggravating factors: Walking, climbing on machines at work, sitting prolonged  Relieving factors: Iburpofen, advil   PRECAUTIONS: None  RED FLAGS: None   WEIGHT BEARING RESTRICTIONS: No  FALLS:  Has patient fallen in last 6 months? Yes. Number of falls 1. 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  DIAGNOSTIC FINDINGS:  N/A  PATIENT SURVEYS:  Lower Extremity Functional Score: 59 / 80 = 73.8 %   COGNITION: Overall cognitive status: Within functional limits for tasks assessed     SENSATION: WFL  MUSCLE LENGTH: Hamstrings: Right 140 deg; Left 140 deg Tight piriformis bilaterally when put in figure 4 position  POSTURE: rounded shoulders, forward head, decreased lumbar lordosis, and increased thoracic kyphosis  PALPATION: TTP in L3-4 with CPA Tight paraspinals throughout  LUMBAR ROM:   AROM eval  Flexion Mid shin, * down BLE  Extension  50% *  Right lateral flexion To knee  Left lateral flexion To knee  Right rotation WFL *  Left rotation WFL    (Blank rows = not tested)    *=painful   LOWER EXTREMITY ROM:     Active  Right eval Left eval  Hip flexion    Hip extension    Hip abduction    Hip adduction    Hip internal rotation    Hip external rotation    Knee flexion    Knee extension    Ankle dorsiflexion    Ankle plantarflexion    Ankle inversion    Ankle eversion      (Blank rows = not tested)  LOWER EXTREMITY MMT:    MMT Right eval Left eval  Hip flexion 4+ 4+  Hip extension 4- 4  Hip abduction 4- 4  Hip adduction    Hip internal rotation    Hip external rotation    Knee flexion    Knee extension    Ankle dorsiflexion 5 5  Ankle plantarflexion    Ankle inversion    Ankle eversion     (Blank rows = not tested)  LUMBAR SPECIAL TESTS:  Straight leg raise test: Positive   FUNCTIONAL TESTS:  30 seconds chair stand test: 11 STS, no UE support, inc pain started in RLE towards end  GAIT: Distance walked: 100 ft in session  Assistive device utilized: None Level of assistance: Complete Independence Comments: WNL at time of eval  TREATMENT DATE:                                    07/11/24 EXERCISE LOG  Exercise Repetitions and Resistance Comments  Treadmill 1.8 mph @ 2% incline x 6 minutes    Standing HS stretch   3 x 30 seconds each    Ball roll out  2 minutes  Multidirectional   Seated HS isometric  10 reps w/ 5 second hold  RLE only   SLS   30 seconds each   HEP review and education     Lunges onto step  12 step x 20 reps each    Standing gastroc stretch  3 x 30 seconds    Standing donkey kick  20 reps each     Blank cell = exercise not performed today   07/03/2024  Therapeutic Exercise: -Treadmill, 5 minutes, 2.0 grade, speed 1.6>2.2>2.8, pt cued for pain free speed -  DKTC on green exercise ball, 2 sets of 10 reps, pt cued for pain free ROM -LTR on green exercise ball, 2 sets of 10 reps, pt cued for max ROM -Supine bridges 2 sets of 8 reps, 3 second holds, symptomatic, pt cued for max hip extension, second set with GTB at knees Neuromuscular Re-education: -Bird dogs, 2 sets of 5 reps, pt cued for core/lumbar activation -Side planks, 2 reps of 30 second holds, pt cued for LE placement -Modified crunch, 1 sets of 10 reps, 5 second hold, pt cued for LLE up for decreased symptoms -Lateral stepping, GTB at ankles, 1 laps 10 steps  per lap, pt cued for upright posture and core activation -Monster walks on 20 foot line, 2 laps, GTB at ankles, pt cued for core activation    06/25/24: PT Eval and HEP                                                                                                                                 PATIENT EDUCATION:  Education details:HEP, healing, and activity modifications Person educated: Patient Education method: Explanation and Demonstration Education comprehension: verbalized understanding and returned demonstration  HOME EXERCISE PROGRAM: Access Code: [email or web address] URL: [email or web address] Date: 06/25/2024 Prepared by: Rosaria Powell-Butler  Exercises - Hooklying Hamstring Stretch with Strap  - 2 x daily - 7 x weekly - 2 sets - 30 hold - Seated Hamstring Stretch  - 2 x daily - 7 x weekly - 2 sets - 30 hold - Supine Piriformis Stretch with Foot on Ground  - 2 x daily - 7 x weekly - 2 sets - 30 hold - Seated Piriformis Stretch with Trunk Bend  - 2 x daily - 7 x weekly - 2 sets - 30 hold - Lower Trunk Rotations  - 2 x daily - 7 x weekly - 2 sets - 10 reps -Utilization of lumbar support while driving (print off given)   ASSESSMENT:  CLINICAL IMPRESSION: Patient was progressed with multiple new standing interventions for reduced pain and improved mobility while working. He required minimal cueing with today's new interventions for proper exercise performance. His HEP was reviewed and updated. He reported feeling comfortable with these interventions.. He experienced no pain or discomfort with any of today's interventions. He reported feeling alright upon the conclusion of treatment. Patient continues to require skilled physical therapy to address her remaining impairments to return to her prior level of function.     Eval: Patient is a 23 y.o. male who was seen today for physical therapy evaluation and treatment for M54.50 (ICD-10-CM) - Low back pain, unspecified M79.605  (ICD-10-CM) - Pain in left leg M62.9 (ICD-10-CM) - Disorder of muscle, unspecified. On this date, patient demonstrates impaired self perception of function, decreased LE strength, decreased lumbar ROM, tight piriformis, poor posture, hamstring, paraspinal musculature, and pain with palpation to lumbar spine all of which may be contributing factors to patient increased and radiating pain. Patient  reports prolonged sitting as a part of his job duties. Educated and gave print off example of lumbar support for better posture of spine to help with decreasing pain. Trial of sitting with lumbar support in session with patient reporting relief to low back and hip pain but increased discomfort between shoulder blades. Educated this can be due to muscle imbalances. Patient will benefit from continued skilled physical therapy in order to address the above/below deficits in order to reduce pain and improve function/quality of life.    OBJECTIVE IMPAIRMENTS: decreased activity tolerance, decreased endurance, decreased mobility, decreased ROM, decreased strength, hypomobility, impaired perceived functional ability, impaired flexibility, improper body mechanics, postural dysfunction, and pain.   ACTIVITY LIMITATIONS: carrying, lifting, bending, sitting, standing, squatting, stairs, transfers, and bed mobility  PARTICIPATION LIMITATIONS: meal prep, cleaning, laundry, driving, community activity, and occupation  PERSONAL FACTORS: N/A are also affecting patient's functional outcome.   REHAB POTENTIAL: Good  CLINICAL DECISION MAKING: Stable/uncomplicated  EVALUATION COMPLEXITY: Low   GOALS: Goals reviewed with patient? No  SHORT TERM GOALS: Target date: 07/16/24 Patient will be independent with performance of HEP to demonstrate adequate self management of symptoms.  Baseline:  Goal status: INITIAL  2.   Patient will report at least a 25% improvement with function and/or pain reduction overall since beginning  PT. Baseline:  Goal status: INITIAL   LONG TERM GOALS: Target date: 08/20/24 Patient will improve LEFS score by 9 points to demonstrate improved perceived function while meeting MCID.  Baseline: Goal status: INITIAL 2.  Patient will improve  hamstring  ROM by at least 10 degrees to demonstrate improved LE flexibility/mobility needed for functional transfers and gait mechanics to reduce pain.  Baseline:  Goal status: INITIAL 3.  Patient will score at least a  4/5 on  RLE hip ext/abd MMT to demonstrate increased LE strength and/or power needed to improve ambulation/gait mechanics.  Baseline:  Goal status: INITIAL   4.  Patient will improve 30 second STS test by at least 3 STS with decreased pain rating (3/10) or less to demonstrate improved activity tolerance.  Baseline: Goal status: INITIAL 5.   Patient will report at least a 75% improvement with function and/or radiating pain reduction overall since beginning PT. Baseline:  Goal status: INITIAL  PLAN:  PT FREQUENCY: 1x/week  PT DURATION: 8 weeks  PLANNED INTERVENTIONS: 97164- PT Re-evaluation, 97110-Therapeutic exercises, 97530- Therapeutic activity, [id number]- Neuromuscular re-education, 97535- Self Care, 02859- Manual therapy, [id number]- Gait training, [id number]- Electrical stimulation (manual), [id number]- Traction (mechanical), [phone] (1-2 muscles), 20561 (3+ muscles)- Dry Needling, Patient/Family education, Balance training, Stair training, Taping, Joint mobilization, Spinal mobilization, Cryotherapy, and Moist heat.  PLAN FOR NEXT SESSION: Review HEP and goals, cont improving LE flexibility, lumbar mobility, address posture, progress endurance training   Lacinda Fass, PT, DPT  4:48 PM, 07/11/24

## 2024-07-23 ENCOUNTER — Ambulatory Visit (HOSPITAL_COMMUNITY): Admitting: Physical Therapy

## 2024-07-23 DIAGNOSIS — M545 Low back pain, unspecified: Secondary | ICD-10-CM

## 2024-07-23 DIAGNOSIS — M5459 Other low back pain: Secondary | ICD-10-CM | POA: Diagnosis not present

## 2024-07-23 DIAGNOSIS — R293 Abnormal posture: Secondary | ICD-10-CM

## 2024-07-23 DIAGNOSIS — R29898 Other symptoms and signs involving the musculoskeletal system: Secondary | ICD-10-CM

## 2024-07-23 NOTE — Therapy (Signed)
 OUTPATIENT PHYSICAL THERAPY THORACOLUMBAR TREATMENT   [name] MRN: [id number] DOB:21-Dec-2000, 23 y.o., male Today's Date: 07/23/2024  END OF SESSION:  PT End of Session - 07/23/24 1653     Visit Number 3    Number of Visits 10    Date for Recertification  07/23/24    Authorization Type [employer]BS COMM PPO    Authorization Time Period no auth req    Progress Note Due on Visit 10    PT Start Time 1545    PT Stop Time 1625    PT Time Calculation (min) 40 min    Activity Tolerance Patient tolerated treatment well    Behavior During Therapy WFL for tasks assessed/performed             No past medical history on file. No past surgical history on file. There are no active problems to display for this patient.   PCP: Chinita Hoy LITTIE DEVONNA  REFERRING PROVIDER: Donata Snowman, PA-C  REFERRING DIAG: M54.50 (ICD-10-CM) - Low back pain, unspecified M79.605 (ICD-10-CM) - Pain in left leg M62.9 (ICD-10-CM) - Disorder of muscle, unspecified  Rationale for Evaluation and Treatment: Rehabilitation  THERAPY DIAG:  Other low back pain  Leg weakness, bilateral  Abnormal posture  Low back pain radiating to both legs  ONSET DATE: ~5 Months   SUBJECTIVE:                                                                                                                                                                                           SUBJECTIVE STATEMENT: Patient reports that he is not hurting right now but sat at a hockey game and caused increased pain on Sunday.  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   Eval: Patient reports this last Sunday was really bad as far as pain; To the point where he was barely able to walk. Reports his pain is usually from low back and travels to posterior hip near glute region and can sometimes travel to back of knees, on both sides. As of recent, not much back pain as much as it is hip/LE pain.     PERTINENT HISTORY:  Previous knee issues due to running cross country  PAIN:  Are you having pain? Yes: NPRS scale: 0/10 Pain location: Glute/hips bilaterally  Pain description: sharp pain  Aggravating factors: Walking, climbing on machines at work, sitting prolonged  Relieving factors: Iburpofen, advil   PRECAUTIONS: None  RED FLAGS: None   WEIGHT BEARING RESTRICTIONS: No  FALLS:  Has patient fallen in last 6 months? Yes. Number of falls  1. 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  DIAGNOSTIC FINDINGS:  N/A  PATIENT SURVEYS:  Lower Extremity Functional Score: 59 / 80 = 73.8 %   COGNITION: Overall cognitive status: Within functional limits for tasks assessed     SENSATION: WFL  MUSCLE LENGTH: Hamstrings: Right 140 deg; Left 140 deg Tight piriformis bilaterally when put in figure 4 position  POSTURE: rounded shoulders, forward head, decreased lumbar lordosis, and increased thoracic kyphosis  PALPATION: TTP in L3-4 with CPA Tight paraspinals throughout  LUMBAR ROM:   AROM eval  Flexion Mid shin, * down BLE  Extension  50% *  Right lateral flexion To knee  Left lateral flexion To knee  Right rotation WFL *  Left rotation WFL    (Blank rows = not tested)    *=painful    LOWER EXTREMITY MMT:    MMT Right eval Left eval  Hip flexion 4+ 4+  Hip extension 4- 4  Hip abduction 4- 4  Hip adduction    Hip internal rotation    Hip external rotation    Knee flexion    Knee extension    Ankle dorsiflexion 5 5  Ankle  plantarflexion    Ankle inversion    Ankle eversion     (Blank rows = not tested)  LUMBAR SPECIAL TESTS:  Straight leg raise test: Positive   FUNCTIONAL TESTS:  30 seconds chair stand test: 11 STS, no UE support, inc pain started in RLE towards end  GAIT: Distance walked: 100 ft in session  Assistive device utilized: None Level of assistance: Complete Independence Comments: WNL at time of eval  TREATMENT DATE:  07/23/24 Treadmill 5 minutes 2% grade, 1.67mph Standing:  Lateral stepping, GTB at ankles, 1 laps 10 steps per lap, pt cued for upright posture and core activation Monster walks on 20 foot line, 2 laps, GTB at ankles, pt cued for core activation  Gastroc stretch 2X30 Quadruped opposite UE/LE (bird dogs) 2X10 Bridge with green physioball 2X10 no pain Sidelying planks 2X30 each side Long sitting hamstring stretch 3X30 each side Prone rolling of hamstrings (no sign change) use of massage gun to demonstrate with relief                                     07/11/24 EXERCISE LOG  Exercise Repetitions and Resistance Comments  Treadmill 1.8 mph @ 2% incline x 6 minutes    Standing HS stretch   3 x 30 seconds each    Ball roll out  2 minutes  Multidirectional   Seated HS isometric  10 reps w/ 5 second hold  RLE only   SLS   30 seconds each   HEP review and education     Lunges onto step  12 step x 20 reps each    Standing gastroc stretch  3 x 30 seconds    Standing donkey kick  20 reps each     Blank cell =  exercise not performed today   07/03/2024  Therapeutic Exercise: -Treadmill, 5 minutes, 2.0 grade, speed 1.6>2.2>2.8, pt cued for pain free speed -DKTC on green exercise ball, 2 sets of 10 reps, pt cued for pain free ROM -LTR on green exercise ball, 2 sets of 10 reps, pt cued for max ROM -Supine bridges 2 sets of 8 reps, 3 second holds, symptomatic, pt cued for max hip extension, second set with GTB at knees Neuromuscular Re-education: -Bird dogs, 2 sets of 5  reps, pt cued for core/lumbar activation -Side planks, 2 reps of 30 second holds, pt cued for LE placement -Modified crunch, 1 sets of 10 reps, 5 second hold, pt cued for LLE up for decreased symptoms -Lateral stepping, GTB at ankles, 1 laps 10 steps per lap, pt cued for upright posture and core activation -Monster walks on 20 foot line, 2 laps, GTB at ankles, pt cued for core activation    06/25/24: PT Eval and HEP                                                                                                                                 PATIENT EDUCATION:  Education details:HEP, healing, and activity modifications Person educated: Patient Education method: Explanation and Demonstration Education comprehension: verbalized understanding and returned demonstration  HOME EXERCISE PROGRAM: Access Code: [email or web address] URL: [email or web address] Date: 06/25/2024 Prepared by: Rosaria Powell-Butler  Exercises - Hooklying Hamstring Stretch with Strap  - 2 x daily - 7 x weekly - 2 sets - 30 hold - Seated Hamstring Stretch  - 2 x daily - 7 x weekly - 2 sets - 30 hold - Supine Piriformis Stretch with Foot on Ground  - 2 x daily - 7 x weekly - 2 sets - 30 hold - Seated Piriformis Stretch with Trunk Bend  - 2 x daily - 7 x weekly - 2 sets - 30 hold - Lower Trunk Rotations  - 2 x daily - 7 x weekly - 2 sets - 10 reps -Utilization of lumbar support while driving (print off given)   ASSESSMENT:  CLINICAL IMPRESSION: Began on treadmill this session with cues for longer stride, otherwise good cadence achieved.  Pt able to maintain full 30 second stab with sidelying planks on either side and stay in straight posturing. Piriformis stretch in supine revealed only minimal tightness, however hamstrings are severely tight, Lt>Rt.  Instructed to do these in seated position and also shown in standing. Pt shown massage gun which helped to reduce some tightness in his hamstrings and suggested he  purchase one of these to use on himself.  Encouraged to take breaks with driving trip to stretch is legs and hopefully avoid increased pain. Patient continues to require skilled physical therapy to address her remaining impairments to return to her prior level of function.     Eval: Patient is a 23 y.o. male who was seen today for physical therapy evaluation and treatment for M54.50 (  ICD-10-CM) - Low back pain, unspecified M79.605 (ICD-10-CM) - Pain in left leg M62.9 (ICD-10-CM) - Disorder of muscle, unspecified. On this date, patient demonstrates impaired self perception of function, decreased LE strength, decreased lumbar ROM, tight piriformis, poor posture, hamstring, paraspinal musculature, and pain with palpation to lumbar spine all of which may be contributing factors to patient increased and radiating pain. Patient reports prolonged sitting as a part of his job duties. Educated and gave print off example of lumbar support for better posture of spine to help with decreasing pain. Trial of sitting with lumbar support in session with patient reporting relief to low back and hip pain but increased discomfort between shoulder blades. Educated this can be due to muscle imbalances. Patient will benefit from continued skilled physical therapy in order to address the above/below deficits in order to reduce pain and improve function/quality of life.    OBJECTIVE IMPAIRMENTS: decreased activity tolerance, decreased endurance, decreased mobility, decreased ROM, decreased strength, hypomobility, impaired perceived functional ability, impaired flexibility, improper body mechanics, postural dysfunction, and pain.   ACTIVITY LIMITATIONS: carrying, lifting, bending, sitting, standing, squatting, stairs, transfers, and bed mobility  PARTICIPATION LIMITATIONS: meal prep, cleaning, laundry, driving, community activity, and occupation  PERSONAL FACTORS: N/A are also affecting patient's functional outcome.   REHAB  POTENTIAL: Good  CLINICAL DECISION MAKING: Stable/uncomplicated  EVALUATION COMPLEXITY: Low   GOALS: Goals reviewed with patient? No  SHORT TERM GOALS: Target date: 07/16/24 Patient will be independent with performance of HEP to demonstrate adequate self management of symptoms.  Baseline:  Goal status: INITIAL  2.   Patient will report at least a 25% improvement with function and/or pain reduction overall since beginning PT. Baseline:  Goal status: INITIAL   LONG TERM GOALS: Target date: 08/20/24 Patient will improve LEFS score by 9 points to demonstrate improved perceived function while meeting MCID.  Baseline: Goal status: INITIAL 2.  Patient will improve  hamstring  ROM by at least 10 degrees to demonstrate improved LE flexibility/mobility needed for functional transfers and gait mechanics to reduce pain.  Baseline:  Goal status: INITIAL 3.  Patient will score at least a  4/5 on  RLE hip ext/abd MMT to demonstrate increased LE strength and/or power needed to improve ambulation/gait mechanics.  Baseline:  Goal status: INITIAL   4.  Patient will improve 30 second STS test by at least 3 STS with decreased pain rating (3/10) or less to demonstrate improved activity tolerance.  Baseline: Goal status: INITIAL 5.   Patient will report at least a 75% improvement with function and/or radiating pain reduction overall since beginning PT. Baseline:  Goal status: INITIAL  PLAN:  PT FREQUENCY: 1x/week  PT DURATION: 8 weeks  PLANNED INTERVENTIONS: 97164- PT Re-evaluation, 97110-Therapeutic exercises, 97530- Therapeutic activity, [id number]- Neuromuscular re-education, 97535- Self Care, 02859- Manual therapy, [id number]- Gait training, [phone]- Electrical stimulation (manual), [id number]- Traction (mechanical), [phone] (1-2 muscles), 20561 (3+ muscles)- Dry Needling, Patient/Family education, Balance training, Stair training, Taping, Joint mobilization, Spinal mobilization, Cryotherapy, and Moist  heat.  PLAN FOR NEXT SESSION: Review HEP and goals, cont improving LE flexibility, lumbar mobility, address posture, progress endurance training.  Begin prone planks  Greig KATHEE Fuse, PTA/CLT [facility] Ph: [phone]   4:54 PM, 07/23/24

## 2024-07-30 ENCOUNTER — Encounter (HOSPITAL_COMMUNITY): Admitting: Physical Therapy

## 2024-08-07 ENCOUNTER — Encounter (HOSPITAL_COMMUNITY)

## 2024-08-14 ENCOUNTER — Encounter (HOSPITAL_COMMUNITY)

## 2024-08-21 ENCOUNTER — Ambulatory Visit (HOSPITAL_COMMUNITY): Attending: Physician Assistant

## 2024-08-21 ENCOUNTER — Encounter (HOSPITAL_COMMUNITY): Payer: Self-pay

## 2024-08-21 DIAGNOSIS — M79604 Pain in right leg: Secondary | ICD-10-CM | POA: Insufficient documentation

## 2024-08-21 DIAGNOSIS — M79605 Pain in left leg: Secondary | ICD-10-CM | POA: Insufficient documentation

## 2024-08-21 DIAGNOSIS — R293 Abnormal posture: Secondary | ICD-10-CM | POA: Insufficient documentation

## 2024-08-21 DIAGNOSIS — M5459 Other low back pain: Secondary | ICD-10-CM | POA: Insufficient documentation

## 2024-08-21 DIAGNOSIS — R29898 Other symptoms and signs involving the musculoskeletal system: Secondary | ICD-10-CM | POA: Insufficient documentation

## 2024-08-21 DIAGNOSIS — M545 Low back pain, unspecified: Secondary | ICD-10-CM | POA: Insufficient documentation

## 2024-08-21 NOTE — Therapy (Addendum)
 OUTPATIENT PHYSICAL THERAPY THORACOLUMBAR TREATMENT   [name] MRN: [id number] DOB:[birth year], 23 y.o., male Today's Date: 08/21/2024  END OF SESSION:  PT End of Session - 08/21/24 1544     Visit Number 5    Number of Visits 9    Date for Recertification  09/19/24    Authorization Type [employer] COMM PPO    Authorization Time Period no auth req    Progress Note Due on Visit 9    PT Start Time 1545    PT Stop Time 1625    PT Time Calculation (min) 40 min    Activity Tolerance Patient tolerated treatment well    Behavior During Therapy WFL for tasks assessed/performed              History reviewed. No pertinent past medical history. History reviewed. No pertinent surgical history. There are no active problems to display for this patient.   PCP: Chinita Hoy LITTIE DEVONNA  REFERRING PROVIDER: Donata Snowman, PA-C  REFERRING DIAG: M54.50 (ICD-10-CM) - Low back pain, unspecified M79.605 (ICD-10-CM) - Pain in left leg M62.9 (ICD-10-CM) - Disorder of muscle, unspecified  Rationale for Evaluation and Treatment: Rehabilitation  THERAPY DIAG:  Other low back pain - Plan: PT plan of care cert/re-cert  Leg weakness, bilateral - Plan: PT plan of care cert/re-cert  Abnormal posture - Plan: PT plan of care cert/re-cert  Low back pain radiating to both legs - Plan: PT plan of care cert/re-cert  ONSET DATE: ~5 Months   SUBJECTIVE:                                                                                                                                                                                           SUBJECTIVE STATEMENT: Pt states he just drove back from [place]  today. Pt states morning continues to be the main issue with the back. Pt states pain 5-6/10.    Eval: Patient reports this last Sunday was really bad as far as pain; To the point where he was barely able to walk. Reports his pain is usually from low back and travels to posterior hip  near glute region and can sometimes travel to back of knees, on both sides. As of recent, not much back pain as much as it is hip/LE pain.    PERTINENT HISTORY:  Previous knee issues due to running cross country  PAIN:  Are you having pain? Yes: NPRS scale: 0/10 Pain location: Glute/hips bilaterally  Pain description: sharp pain  Aggravating factors: Walking, climbing on machines at work, sitting prolonged  Relieving factors: Iburpofen, advil   PRECAUTIONS: None  RED FLAGS: None  WEIGHT BEARING RESTRICTIONS: No  FALLS:  Has patient fallen in last 6 months? Yes. Number of falls 1. 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  DIAGNOSTIC FINDINGS:  N/A  PATIENT SURVEYS:  Lower Extremity Functional Score: 59 / 80 = 73.8 % LEFS 08/21/24: 71 / 80 = 88.8 %  COGNITION: Overall cognitive status: Within functional limits for tasks assessed     SENSATION: WFL  MUSCLE LENGTH: Hamstrings: Right 140 deg; Left 140 deg Tight piriformis bilaterally when put in figure 4 position  Hamstrings 08/21/24: 150 on RLE, 143 on LLE  POSTURE: rounded shoulders, forward head, decreased lumbar lordosis, and increased thoracic kyphosis  PALPATION: TTP in L3-4 with CPA Tight paraspinals throughout  LUMBAR ROM:   AROM eval  Flexion Mid shin, * down BLE  Extension  50% *  Right lateral flexion To knee  Left lateral flexion To knee  Right rotation WFL *  Left rotation WFL    (Blank rows = not tested)    *=painful    LOWER EXTREMITY MMT:    MMT Right eval  Left eval Right 08/21/24 Left  08/21/24  Hip flexion 4+ 4+ 4+ 4+  Hip extension 4- 4 4-, little pain 4-, little pain  Hip abduction 4- 4 4+ 4  Hip adduction      Hip internal rotation      Hip external rotation      Knee flexion      Knee extension      Ankle dorsiflexion 5 5    Ankle plantarflexion      Ankle inversion      Ankle eversion       (Blank rows = not tested)  LUMBAR SPECIAL TESTS:  Straight leg raise test: Positive   FUNCTIONAL TESTS:  30 seconds chair stand test: 11 STS, no UE support, inc pain started in RLE towards end 30 seconds chair stand test: 13 reps, no increased pain in the low back GAIT: Distance walked: 100 ft in session  Assistive device utilized: None Level of assistance: Complete Independence Comments: WNL at time of eval  TREATMENT DATE:  08/21/2024  PN: Goals tracked, 30 second chair stand test, LEFS, HEP review and progressed this date, education on POC and importance of HEP compliance  07/23/24 Treadmill 5 minutes 2% grade, 1.27mph Standing:  Lateral stepping, GTB at ankles, 1 laps 10 steps per lap, pt cued for upright posture and core activation Monster walks on 20 foot line, 2 laps, GTB at ankles, pt cued for core activation  Gastroc stretch 2X30 Quadruped opposite UE/LE (bird dogs) 2X10 Bridge with green physioball 2X10 no pain Sidelying planks 2X30 each side Long sitting hamstring stretch 3X30 each side Prone rolling of hamstrings (no sign change) use of massage gun to demonstrate with relief                                     07/11/24 EXERCISE LOG  Exercise Repetitions and  Resistance Comments  Treadmill 1.8 mph @ 2% incline x 6 minutes    Standing HS stretch   3 x 30 seconds each    Ball roll out  2 minutes  Multidirectional   Seated HS isometric  10 reps w/ 5 second hold  RLE only   SLS   30 seconds each   HEP review and education     Lunges onto step  12 step x 20 reps each    Standing gastroc stretch  3 x 30  seconds    Standing donkey kick  20 reps each     Blank cell = exercise not performed today   07/03/2024  Therapeutic Exercise: -Treadmill, 5 minutes, 2.0 grade, speed 1.6>2.2>2.8, pt cued for pain free speed -DKTC on green exercise ball, 2 sets of 10 reps, pt cued for pain free ROM -LTR on green exercise ball, 2 sets of 10 reps, pt cued for max ROM -Supine bridges 2 sets of 8 reps, 3 second holds, symptomatic, pt cued for max hip extension, second set with GTB at knees Neuromuscular Re-education: -Bird dogs, 2 sets of 5 reps, pt cued for core/lumbar activation -Side planks, 2 reps of 30 second holds, pt cued for LE placement -Modified crunch, 1 sets of 10 reps, 5 second hold, pt cued for LLE up for decreased symptoms -Lateral stepping, GTB at ankles, 1 laps 10 steps per lap, pt cued for upright posture and core activation -Monster walks on 20 foot line, 2 laps, GTB at ankles, pt cued for core activation                                                                                                                      PATIENT EDUCATION:  Education details:HEP, healing, and activity modifications Person educated: Patient Education method: Explanation and Demonstration Education comprehension: verbalized understanding and returned demonstration  HOME EXERCISE PROGRAM: Access Code: [email or web address] URL: [email or web address] Date: 08/21/2024 Prepared by: Lang Ada  Exercises - Hooklying Hamstring Stretch with Strap  - 2 x daily - 7 x weekly - 2 sets - 30 hold - Seated Hamstring Stretch  - 2 x daily - 7 x weekly - 2 sets - 30 hold - Supine Piriformis Stretch with Foot on Ground  - 2 x daily - 7 x weekly - 2 sets - 30 hold - Seated Piriformis Stretch with Trunk Bend  - 2 x daily - 7 x weekly - 2 sets - 30 hold - Lower Trunk Rotations  - 2 x daily - 7 x weekly - 2 sets - 10 reps - Supine Bridge with Resistance Band  - 1 x daily - 7 x weekly - 3 sets - 10 reps - 3 hold -  Forward Monster Walks  - 1 x daily - 7 x weekly - 3 sets - 10 reps - Backward Monster Walks  - 1 x daily - 7 x weekly - 3 sets - 10 reps - Side Stepping with Resistance at Ankles  - 1  x daily - 7 x weekly - 3 sets - 10 reps - Bird Dog  - 1 x daily - 7 x weekly - 2 sets - 8 reps - 3 hold - Side Plank on Elbow  - 1 x daily - 7 x weekly - 1 sets - 3 reps - 20 hold   ASSESSMENT:  CLINICAL IMPRESSION: Patient continues to demonstrate decreased LE strength, decreased gait quality and balance. Pt demonstrates ability to meet 3/7 goals set at initial evaluation. Pt continues to demonstrate decreased flexibility of LLE compared to RLE during hamstring length testing. Patient also demonstrates acute increased in upper back pain this date from just driving home from [place]  earlier this treatment date. Progressed HEP this date, hopeful this will continue pts progress towards remaining goals unmet. Patient would continue to benefit from skilled physical therapy for decreased back pain, increased endurance with ambulation, increased LE/core strength, and improved balance for improved quality of life, improved independence with gait training and continued progress towards therapy goals.      Eval: Patient is a 23 y.o. male who was seen today for physical therapy evaluation and treatment for M54.50 (ICD-10-CM) - Low back pain, unspecified M79.605 (ICD-10-CM) - Pain in left leg M62.9 (ICD-10-CM) - Disorder of muscle, unspecified. On this date, patient demonstrates impaired self perception of function, decreased LE strength, decreased lumbar ROM, tight piriformis, poor posture, hamstring, paraspinal musculature, and pain with palpation to lumbar spine all of which may be contributing factors to patient increased and radiating pain. Patient reports prolonged sitting as a part of his job duties. Educated and gave print off example of lumbar support for better posture of spine to help with decreasing pain. Trial of  sitting with lumbar support in session with patient reporting relief to low back and hip pain but increased discomfort between shoulder blades. Educated this can be due to muscle imbalances. Patient will benefit from continued skilled physical therapy in order to address the above/below deficits in order to reduce pain and improve function/quality of life.    OBJECTIVE IMPAIRMENTS: decreased activity tolerance, decreased endurance, decreased mobility, decreased ROM, decreased strength, hypomobility, impaired perceived functional ability, impaired flexibility, improper body mechanics, postural dysfunction, and pain.   ACTIVITY LIMITATIONS: carrying, lifting, bending, sitting, standing, squatting, stairs, transfers, and bed mobility  PARTICIPATION LIMITATIONS: meal prep, cleaning, laundry, driving, community activity, and occupation  PERSONAL FACTORS: N/A are also affecting patient's functional outcome.   REHAB POTENTIAL: Good  CLINICAL DECISION MAKING: Stable/uncomplicated  EVALUATION COMPLEXITY: Low   GOALS: Goals reviewed with patient? Yes  SHORT TERM GOALS: Target date: 07/16/24 Patient will be independent with performance of HEP to demonstrate adequate self management of symptoms.  Baseline:  Goal status: MET  2.   Patient will report at least a 25% improvement with function and/or pain reduction overall since beginning PT. Baseline:  Goal status: MET   LONG TERM GOALS: Target date: 08/20/24 Patient will improve LEFS score by 9 points to demonstrate improved perceived function while meeting MCID.  Baseline: Goal status: MET 2.  Patient will improve  hamstring  ROM by at least 10 degrees to demonstrate improved LE flexibility/mobility needed for functional transfers and gait mechanics to reduce pain.  Baseline:  Goal status: In progress 3.  Patient will score at least a  4/5 on  RLE hip ext/abd MMT to demonstrate increased LE strength and/or power needed to improve  ambulation/gait mechanics.  Baseline:  Goal status: In progress   4.  Patient will improve  30 second STS test by at least 3 STS with decreased pain rating (3/10) or less to demonstrate improved activity tolerance.  Baseline: Goal status: In progress 5.   Patient will report at least a 75% improvement with function and/or radiating pain reduction overall since beginning PT. Baseline:  Goal status: In progress  PLAN:  PT FREQUENCY: 1x/week  PT DURATION: 4 weeks  PLANNED INTERVENTIONS: 97164- PT Re-evaluation, 97110-Therapeutic exercises, 97530- Therapeutic activity, [id number]- Neuromuscular re-education, 97535- Self Care, [id number]- Manual therapy, [id number]- Gait training, [phone]- Electrical stimulation (manual), [id number]- Traction (mechanical), [phone] (1-2 muscles), [id number] (3+ muscles)- Dry Needling, Patient/Family education, Balance training, Stair training, Taping, Joint mobilization, Spinal mobilization, Cryotherapy, and Moist heat.  PLAN FOR NEXT SESSION: Review HEP, cont improving LE flexibility, lumbar mobility, address posture, progress endurance training.  Begin prone planks, adding one visit to end of POC  Lang Ada, PT, DPT [facility] Office: [phone] 4:32 PM, 08/21/24

## 2024-08-25 ENCOUNTER — Encounter (HOSPITAL_COMMUNITY): Payer: Self-pay

## 2024-08-25 ENCOUNTER — Ambulatory Visit (HOSPITAL_COMMUNITY)

## 2024-08-25 DIAGNOSIS — M5459 Other low back pain: Secondary | ICD-10-CM

## 2024-08-25 DIAGNOSIS — R293 Abnormal posture: Secondary | ICD-10-CM

## 2024-08-25 DIAGNOSIS — R29898 Other symptoms and signs involving the musculoskeletal system: Secondary | ICD-10-CM

## 2024-08-25 DIAGNOSIS — M545 Low back pain, unspecified: Secondary | ICD-10-CM

## 2024-08-25 NOTE — Therapy (Signed)
 OUTPATIENT PHYSICAL THERAPY THORACOLUMBAR TREATMENT   [name] MRN: [id number] DOB:2001-05-03, 23 y.o., male Today's Date: 08/25/2024  END OF SESSION:  PT End of Session - 08/25/24 1551     Visit Number 6    Number of Visits 9    Date for Recertification  09/19/24    Authorization Type [employer] COMM PPO    Authorization Time Period no auth req    Progress Note Due on Visit 9    PT Start Time 1545    PT Stop Time 1627    PT Time Calculation (min) 42 min    Activity Tolerance Patient tolerated treatment well    Behavior During Therapy WFL for tasks assessed/performed               History reviewed. No pertinent past medical history. History reviewed. No pertinent surgical history. There are no active problems to display for this patient.   PCP: Chinita Hoy LITTIE DEVONNA  REFERRING PROVIDER: Donata Snowman, PA-C  REFERRING DIAG: M54.50 (ICD-10-CM) - Low back pain, unspecified M79.605 (ICD-10-CM) - Pain in left leg M62.9 (ICD-10-CM) - Disorder of muscle, unspecified  Rationale for Evaluation and Treatment: Rehabilitation  THERAPY DIAG:  Other low back pain  Leg weakness, bilateral  Abnormal posture  Low back pain radiating to both legs  ONSET DATE: ~5 Months   SUBJECTIVE:                                                                                                                                                                                           SUBJECTIVE STATEMENT: Patient reports that he feels alright right now. However, he is expecting to have to drive back to [place]  on Monday. He expecting to be there for about 1.5 weeks. He is not able to do his HEP as much as he'd like, but he can tell that it is better when he is able to do them.   Eval: Patient reports this last Sunday was really bad as far as pain; To the point where he was barely able to walk. Reports his pain is usually from low back and travels to posterior hip near  glute region and can sometimes travel to back of knees, on both sides. As of recent, not much back pain as much as it is hip/LE pain.    PERTINENT HISTORY:  Previous knee issues due to running cross country  PAIN:  Are you having pain? Yes: NPRS scale: 0/10 Pain location: Glute/hips bilaterally  Pain description: sharp pain  Aggravating factors: Walking, climbing on machines at work, sitting prolonged  Relieving factors: Iburpofen, advil   PRECAUTIONS:  None  RED FLAGS: None   WEIGHT BEARING RESTRICTIONS: No  FALLS:  Has patient fallen in last 6 months? Yes. Number of falls 1. 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  DIAGNOSTIC FINDINGS:  N/A  PATIENT SURVEYS:  Lower Extremity Functional Score: 59 / 80 = 73.8 % LEFS 08/21/24: 71 / 80 = 88.8 %  COGNITION: Overall cognitive status: Within functional limits for tasks assessed     SENSATION: WFL  MUSCLE LENGTH: Hamstrings: Right 140 deg; Left 140 deg Tight piriformis bilaterally when put in figure 4 position  Hamstrings 08/21/24: 150 on RLE, 143 on LLE  POSTURE: rounded shoulders, forward head, decreased lumbar lordosis, and increased thoracic kyphosis  PALPATION: TTP in L3-4 with CPA Tight paraspinals throughout  LUMBAR ROM:   AROM eval  Flexion Mid shin, * down BLE  Extension  50% *  Right lateral flexion To knee  Left lateral flexion To knee  Right rotation WFL *  Left rotation WFL    (Blank rows = not tested)    *=painful    LOWER EXTREMITY MMT:    MMT Right eval  Left eval Right 08/21/24 Left  08/21/24  Hip flexion 4+ 4+ 4+ 4+  Hip extension 4- 4 4-, little pain 4-, little pain  Hip abduction 4- 4 4+ 4  Hip adduction      Hip internal rotation      Hip external rotation      Knee flexion      Knee extension      Ankle dorsiflexion 5 5    Ankle plantarflexion      Ankle inversion      Ankle eversion       (Blank rows = not tested)  LUMBAR SPECIAL TESTS:  Straight leg raise test: Positive   FUNCTIONAL TESTS:  30 seconds chair stand test: 11 STS, no UE support, inc pain started in RLE towards end 30 seconds chair stand test: 13 reps, no increased pain in the low back GAIT: Distance walked: 100 ft in session  Assistive device utilized: None Level of assistance: Complete Independence Comments: WNL at time of eval  TREATMENT DATE:                                    08/25/24 EXERCISE LOG  Exercise Repetitions and Resistance Comments  Treadmill  2% grade @ 2.0 mph x 5 minutes    Standing hamstring stretch  2 x 30 seconds  With hip IR and ER   TFL stretch  30 seconds  RLE only   Standing HS isometric  5 reps each x 5 second hold  With neutral, IR and ER hip position   Eccentric heel tap  6 step x 2 minutes each    Standing heel raise   2 minutes  On step   Cybex HS curl   5 plates x 2 minutes    Leg press   6 plates x 2  minutes    Rebounder 3 kg @  2 x 20 reps each  SLS on foam    Blank cell = exercise not performed today   08/21/2024  PN: Goals tracked, 30 second chair stand test, LEFS, HEP review and progressed this date, education on POC and importance of HEP compliance  07/23/24 Treadmill 5 minutes 2% grade, 1.39mph Standing:  Lateral stepping, GTB at ankles, 1 laps 10 steps per lap, pt cued for upright posture and core activation Monster walks on 20 foot line, 2 laps, GTB at ankles, pt cued for core activation  Gastroc stretch 2X30 Quadruped opposite UE/LE (bird dogs) 2X10 Bridge with green physioball 2X10 no  pain Sidelying planks 2X30 each side Long sitting hamstring stretch 3X30 each side Prone rolling of hamstrings (no sign change) use of massage gun to demonstrate with relief                                     07/11/24 EXERCISE LOG  Exercise Repetitions and Resistance Comments  Treadmill 1.8 mph @ 2% incline x 6 minutes    Standing HS stretch   3 x 30 seconds each    Ball roll out  2 minutes  Multidirectional   Seated HS isometric  10 reps w/ 5 second hold  RLE only   SLS   30 seconds each   HEP review and education     Lunges onto step  12 step x 20 reps each    Standing gastroc stretch  3 x 30 seconds    Standing donkey kick  20 reps each     Blank cell = exercise not performed today   07/03/2024  Therapeutic Exercise: -Treadmill, 5 minutes, 2.0 grade, speed 1.6>2.2>2.8, pt cued for pain free speed -DKTC on green exercise ball, 2 sets of 10 reps, pt cued for pain free ROM -LTR on green exercise ball, 2 sets of 10 reps, pt cued for max ROM -Supine bridges 2 sets of 8 reps, 3 second holds, symptomatic, pt cued for max hip extension, second set with GTB at knees Neuromuscular Re-education: -Bird dogs, 2 sets of 5 reps, pt cued for core/lumbar activation -Side planks, 2 reps of 30 second holds, pt cued for LE placement -Modified crunch, 1 sets of 10 reps, 5 second hold, pt cued for LLE up for decreased symptoms -Lateral stepping, GTB at ankles, 1 laps 10 steps per lap, pt cued for upright posture and core activation -Monster walks on 20 foot line, 2 laps, GTB at ankles, pt cued for core activation                                                                                                                      PATIENT EDUCATION:  Education details:HEP, healing, and activity modifications Person educated: Patient Education method: Medical Illustrator Education comprehension: verbalized understanding and returned demonstration  HOME EXERCISE PROGRAM: Access  Code:  [email or web address] URL: [email or web address] Date: 08/21/2024 Prepared by: Lang Ada  Exercises - Hooklying Hamstring Stretch with Strap  - 2 x daily - 7 x weekly - 2 sets - 30 hold - Seated Hamstring Stretch  - 2 x daily - 7 x weekly - 2 sets - 30 hold - Supine Piriformis Stretch with Foot on Ground  - 2 x daily - 7 x weekly - 2 sets - 30 hold - Seated Piriformis Stretch with Trunk Bend  - 2 x daily - 7 x weekly - 2 sets - 30 hold - Lower Trunk Rotations  - 2 x daily - 7 x weekly - 2 sets - 10 reps - Supine Bridge with Resistance Band  - 1 x daily - 7 x weekly - 3 sets - 10 reps - 3 hold - Forward Monster Walks  - 1 x daily - 7 x weekly - 3 sets - 10 reps - Backward Monster Walks  - 1 x daily - 7 x weekly - 3 sets - 10 reps - Side Stepping with Resistance at Ankles  - 1 x daily - 7 x weekly - 3 sets - 10 reps - Bird Dog  - 1 x daily - 7 x weekly - 2 sets - 8 reps - 3 hold - Side Plank on Elbow  - 1 x daily - 7 x weekly - 1 sets - 3 reps - 20 hold   ASSESSMENT:  CLINICAL IMPRESSION: Patient was progressed with new and familiar interventions for improved lower extremity strength and updates to his HEP due to his upcoming trip out of state. He required minimal cueing with today's new interventions for proper exercise performance. He experienced no increase in pain or discomfort with any of today's interventions. He reported feeling alright upon the conclusion of treatment. Patient continues to require skilled physical therapy to address her remaining impairments to return to her prior level of function.      Eval: Patient is a 23 y.o. male who was seen today for physical therapy evaluation and treatment for M54.50 (ICD-10-CM) - Low back pain, unspecified M79.605 (ICD-10-CM) - Pain in left leg M62.9 (ICD-10-CM) - Disorder of muscle, unspecified. On this date, patient demonstrates impaired self perception of function, decreased LE strength, decreased lumbar ROM, tight  piriformis, poor posture, hamstring, paraspinal musculature, and pain with palpation to lumbar spine all of which may be contributing factors to patient increased and radiating pain. Patient reports prolonged sitting as a part of his job duties. Educated and gave print off example of lumbar support for better posture of spine to help with decreasing pain. Trial of sitting with lumbar support in session with patient reporting relief to low back and hip pain but increased discomfort between shoulder blades. Educated this can be due to muscle imbalances. Patient will benefit from continued skilled physical therapy in order to address the above/below deficits in order to reduce pain and improve function/quality of life.    OBJECTIVE IMPAIRMENTS: decreased activity tolerance, decreased endurance, decreased mobility, decreased ROM, decreased strength, hypomobility, impaired perceived functional ability, impaired flexibility, improper body mechanics, postural dysfunction, and pain.   ACTIVITY LIMITATIONS: carrying, lifting, bending, sitting, standing, squatting, stairs, transfers, and bed mobility  PARTICIPATION LIMITATIONS: meal prep, cleaning, laundry, driving, community activity, and occupation  PERSONAL FACTORS: N/A are also affecting patient's functional outcome.   REHAB POTENTIAL: Good  CLINICAL DECISION MAKING: Stable/uncomplicated  EVALUATION COMPLEXITY: Low   GOALS: Goals reviewed with patient? Yes  SHORT TERM GOALS: Target date: 07/16/24  Patient will be independent with performance of HEP to demonstrate adequate self management of symptoms.  Baseline:  Goal status: MET  2.   Patient will report at least a 25% improvement with function and/or pain reduction overall since beginning PT. Baseline:  Goal status: MET   LONG TERM GOALS: Target date: 08/20/24 Patient will improve LEFS score by 9 points to demonstrate improved perceived function while meeting MCID.  Baseline: Goal status:  MET 2.  Patient will improve  hamstring  ROM by at least 10 degrees to demonstrate improved LE flexibility/mobility needed for functional transfers and gait mechanics to reduce pain.  Baseline:  Goal status: In progress 3.  Patient will score at least a  4/5 on  RLE hip ext/abd MMT to demonstrate increased LE strength and/or power needed to improve ambulation/gait mechanics.  Baseline:  Goal status: In progress   4.  Patient will improve 30 second STS test by at least 3 STS with decreased pain rating (3/10) or less to demonstrate improved activity tolerance.  Baseline: Goal status: In progress 5.   Patient will report at least a 75% improvement with function and/or radiating pain reduction overall since beginning PT. Baseline:  Goal status: In progress  PLAN:  PT FREQUENCY: 1x/week  PT DURATION: 4 weeks  PLANNED INTERVENTIONS: 97164- PT Re-evaluation, 97110-Therapeutic exercises, 97530- Therapeutic activity, [id number]- Neuromuscular re-education, 97535- Self Care, [id number]- Manual therapy, [id number]- Gait training, [phone]- Electrical stimulation (manual), [id number]- Traction (mechanical), [phone] (1-2 muscles), [id number] (3+ muscles)- Dry Needling, Patient/Family education, Balance training, Stair training, Taping, Joint mobilization, Spinal mobilization, Cryotherapy, and Moist heat.  PLAN FOR NEXT SESSION: Review HEP, cont improving LE flexibility, lumbar mobility, address posture, progress endurance training.  Begin prone planks, adding one visit to end of POC  Lacinda Fass, PT, DPT  5:33 PM, 08/25/24

## 2024-09-01 ENCOUNTER — Ambulatory Visit (HOSPITAL_COMMUNITY)

## 2024-09-10 ENCOUNTER — Ambulatory Visit (HOSPITAL_COMMUNITY)

## 2024-09-12 ENCOUNTER — Encounter (HOSPITAL_COMMUNITY): Payer: Self-pay

## 2024-09-12 ENCOUNTER — Ambulatory Visit (HOSPITAL_COMMUNITY): Attending: Physician Assistant

## 2024-09-12 DIAGNOSIS — M5459 Other low back pain: Secondary | ICD-10-CM | POA: Diagnosis present

## 2024-09-12 DIAGNOSIS — M545 Low back pain, unspecified: Secondary | ICD-10-CM | POA: Insufficient documentation

## 2024-09-12 DIAGNOSIS — R293 Abnormal posture: Secondary | ICD-10-CM | POA: Diagnosis present

## 2024-09-12 DIAGNOSIS — M79605 Pain in left leg: Secondary | ICD-10-CM | POA: Insufficient documentation

## 2024-09-12 DIAGNOSIS — R29898 Other symptoms and signs involving the musculoskeletal system: Secondary | ICD-10-CM

## 2024-09-12 DIAGNOSIS — M79604 Pain in right leg: Secondary | ICD-10-CM

## 2024-09-12 NOTE — Therapy (Signed)
 OUTPATIENT PHYSICAL THERAPY THORACOLUMBAR TREATMENT   [name] MRN: [id number] DOB:2001-09-17, 23 y.o., male Today's Date: 09/12/2024  END OF SESSION:  PT End of Session - 09/12/24 0901     Visit Number 7    Number of Visits 9    Date for Recertification  09/19/24    Authorization Type BCBS COMM PPO    Authorization Time Period no auth req    Progress Note Due on Visit 9    PT Start Time 0901    PT Stop Time 0941    PT Time Calculation (min) 40 min    Activity Tolerance Patient tolerated treatment well    Behavior During Therapy [facility] for tasks assessed/performed                History reviewed. No pertinent past medical history. History reviewed. No pertinent surgical history. There are no active problems to display for this patient.   PCP: Chinita Hoy LITTIE DEVONNA  REFERRING PROVIDER: Donata Snowman, PA-C  REFERRING DIAG: M54.50 (ICD-10-CM) - Low back pain, unspecified M79.605 (ICD-10-CM) - Pain in left leg M62.9 (ICD-10-CM) - Disorder of muscle, unspecified  Rationale for Evaluation and Treatment: Rehabilitation  THERAPY DIAG:  Other low back pain  Leg weakness, bilateral  Abnormal posture  Low back pain radiating to both legs  ONSET DATE: ~5 Months   SUBJECTIVE:                                                                                                                                                                                           SUBJECTIVE STATEMENT: Pt states no pain upon presentation, pt states it still hurts when he first wakes up. Pt states the back seems to be handling the long drives a little bit better.   Eval: Patient reports this last Sunday was really bad as far as pain; To the point where he was barely able to walk. Reports his pain is usually from low back and travels to posterior hip near glute region and can sometimes travel to back of knees, on both sides. As of recent, not much back pain as much as  it is hip/LE pain.    PERTINENT HISTORY:  Previous knee issues due to running cross country  PAIN:  Are you having pain? Yes: NPRS scale: 0/10 Pain location: Glute/hips bilaterally  Pain description: sharp pain  Aggravating factors: Walking, climbing on machines at work, sitting prolonged  Relieving factors: Iburpofen, advil   PRECAUTIONS: None  RED FLAGS: None   WEIGHT BEARING RESTRICTIONS: No  FALLS:  Has patient fallen in last 6 months? Yes. Number of falls 1.  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  DIAGNOSTIC FINDINGS:  N/A  PATIENT SURVEYS:  Lower Extremity Functional Score: 59 / 80 = 73.8 % LEFS 08/21/24: 71 / 80 = 88.8 %  COGNITION: Overall cognitive status: Within functional limits for tasks assessed     SENSATION: WFL  MUSCLE LENGTH: Hamstrings: Right 140 deg; Left 140 deg Tight piriformis bilaterally when put in figure 4 position  Hamstrings 08/21/24: 150 on RLE, 143 on LLE  POSTURE: rounded shoulders, forward head, decreased lumbar lordosis, and increased thoracic kyphosis  PALPATION: TTP in L3-4 with CPA Tight paraspinals throughout  LUMBAR ROM:   AROM eval  Flexion Mid shin, * down BLE  Extension  50% *  Right lateral flexion To knee  Left lateral flexion To knee  Right rotation WFL *  Left rotation WFL    (Blank rows = not tested)    *=painful    LOWER EXTREMITY MMT:    MMT Right eval Left eval Right 08/21/24 Left  08/21/24  Hip flexion 4+ 4+ 4+ 4+  Hip extension 4- 4 4-, little pain 4-, little  pain  Hip abduction 4- 4 4+ 4  Hip adduction      Hip internal rotation      Hip external rotation      Knee flexion      Knee extension      Ankle dorsiflexion 5 5    Ankle plantarflexion      Ankle inversion      Ankle eversion       (Blank rows = not tested)  LUMBAR SPECIAL TESTS:  Straight leg raise test: Positive   FUNCTIONAL TESTS:  30 seconds chair stand test: 11 STS, no UE support, inc pain started in RLE towards end 30 seconds chair stand test: 13 reps, no increased pain in the low back GAIT: Distance walked: 100 ft in session  Assistive device utilized: None Level of assistance: Complete Independence Comments: WNL at time of eval  TREATMENT DATE:  09/12/2024   Therapeutic Exercise: -Treadmill, 5 minutes, level 2.0 grade, speed 1.9>2.3>2.8>3.2 Neuromuscular Re-education: -Paloff press, 2 set of 10 reps, Black TB at chest level on web slide, pt cued for sequencing, second set on blue foam -Standard plank, 1 set of 60 seconds, pt cued for decreased hip elevation -Side plank, 1 set of 1 rep of 30 second holds, bilaterally, pt cued for increased hip elevation and proper LE/UE placement -Bird dog, 1 set of 10 reps, bilaterally, pt cued for increased hip ROM and neutral spine throughout movement -Dead bug, 1 set 10 reps, bilaterally, pt cued for 1 second hold, (pt reports increase in RLE pain*) Therapeutic Activity: -Kettle bell swings, 2 sets of 10 reps, 10lb kettle bell, pt cued for neutral lumbar spine -Single leg Romanian dead lift, 10 lb kettle bell to 4 inch step, 1 set of 10 reps bilaterally, pt cued for core activation and neutral spine -Sled pushes/pulls, 30lbs of plates, 4 laps, 30 feet per lap, pt cued for increased step length and  increased pace as tolerated                                   08/25/24 EXERCISE LOG  Exercise Repetitions and Resistance Comments  Treadmill  2% grade @ 2.0 mph x 5 minutes    Standing hamstring stretch  2 x 30 seconds  With hip  IR and ER   TFL stretch  30 seconds  RLE only   Standing HS isometric  5 reps each x 5 second hold  With neutral, IR and ER hip position   Eccentric heel tap  6 step x 2 minutes each    Standing heel raise   2 minutes  On step   Cybex HS curl   5 plates x 2 minutes    Leg press   6 plates x 2 minutes    Rebounder 3 kg @  2 x 20 reps each  SLS on foam    Blank cell = exercise not performed today   08/21/2024  PN: Goals tracked, 30 second chair stand test, LEFS, HEP review and progressed this date, education on POC and importance of HEP compliance   PATIENT EDUCATION:  Education details:HEP, healing, and activity modifications Person educated: Patient Education method: Explanation and Demonstration Education comprehension: verbalized understanding and returned demonstration  HOME EXERCISE PROGRAM: Access Code: [email or web address] URL: [email or web address] Date: 08/21/2024 Prepared by: Lang Ada  Exercises - Hooklying Hamstring Stretch with Strap  - 2 x daily - 7 x weekly - 2 sets - 30 hold - Seated Hamstring Stretch  - 2 x daily - 7 x weekly - 2 sets - 30 hold - Supine Piriformis Stretch with Foot on Ground  - 2 x daily - 7 x weekly - 2 sets - 30 hold - Seated Piriformis Stretch with Trunk Bend  - 2 x daily - 7 x weekly - 2 sets - 30 hold - Lower Trunk Rotations  - 2 x daily - 7 x weekly - 2 sets - 10 reps - Supine Bridge with Resistance Band  - 1 x daily - 7 x weekly - 3 sets - 10 reps - 3 hold - Forward Monster Walks  - 1 x daily - 7 x weekly - 3 sets - 10 reps - Backward Monster Walks  - 1 x daily - 7 x weekly - 3 sets - 10 reps - Side Stepping with Resistance at Ankles  - 1 x daily - 7 x weekly - 3 sets - 10 reps - Bird Dog  - 1 x daily - 7 x weekly - 2 sets - 8 reps - 3 hold - Side Plank on Elbow  - 1 x daily - 7 x weekly - 1 sets - 3 reps - 20 hold   ASSESSMENT:  CLINICAL IMPRESSION: Patient demonstrates increased activity tolerance with no pain upon presentation  even following recent driving back to [place] . Pt continues to demonstrate improvements in LE strength, gait quality and balance. Patient also demonstrates decreased endurance with aerobic based exercise during today's session on treadmill with increased speed challenge. Patient able to progress dynamic balance and core activation exercises today with sled push/pull and dead lift variation, good performance with verbal cueing. Slight reproduction of symptoms with dead bugs in RLE, caution in performing this movement in the future. Patient would continue to benefit from skilled physical therapy for increased endurance with ambulation, increased LE strength, and improved balance for  improved quality of life, improved independence with gait training and continued progress towards therapy goals.      Eval: Patient is a 23 y.o. male who was seen today for physical therapy evaluation and treatment for M54.50 (ICD-10-CM) - Low back pain, unspecified M79.605 (ICD-10-CM) - Pain in left leg M62.9 (ICD-10-CM) - Disorder of muscle, unspecified. On this date, patient demonstrates impaired self perception of function, decreased LE strength, decreased lumbar ROM, tight piriformis, poor posture, hamstring, paraspinal musculature, and pain with palpation to lumbar spine all of which may be contributing factors to patient increased and radiating pain. Patient reports prolonged sitting as a part of his job duties. Educated and gave print off example of lumbar support for better posture of spine to help with decreasing pain. Trial of sitting with lumbar support in session with patient reporting relief to low back and hip pain but increased discomfort between shoulder blades. Educated this can be due to muscle imbalances. Patient will benefit from continued skilled physical therapy in order to address the above/below deficits in order to reduce pain and improve function/quality of life.    OBJECTIVE IMPAIRMENTS: decreased  activity tolerance, decreased endurance, decreased mobility, decreased ROM, decreased strength, hypomobility, impaired perceived functional ability, impaired flexibility, improper body mechanics, postural dysfunction, and pain.   ACTIVITY LIMITATIONS: carrying, lifting, bending, sitting, standing, squatting, stairs, transfers, and bed mobility  PARTICIPATION LIMITATIONS: meal prep, cleaning, laundry, driving, community activity, and occupation  PERSONAL FACTORS: N/A are also affecting patient's functional outcome.   REHAB POTENTIAL: Good  CLINICAL DECISION MAKING: Stable/uncomplicated  EVALUATION COMPLEXITY: Low   GOALS: Goals reviewed with patient? Yes  SHORT TERM GOALS: Target date: 07/16/24 Patient will be independent with performance of HEP to demonstrate adequate self management of symptoms.  Baseline:  Goal status: MET  2.   Patient will report at least a 25% improvement with function and/or pain reduction overall since beginning PT. Baseline:  Goal status: MET   LONG TERM GOALS: Target date: 08/20/24 Patient will improve LEFS score by 9 points to demonstrate improved perceived function while meeting MCID.  Baseline: Goal status: MET 2.  Patient will improve  hamstring  ROM by at least 10 degrees to demonstrate improved LE flexibility/mobility needed for functional transfers and gait mechanics to reduce pain.  Baseline:  Goal status: In progress 3.  Patient will score at least a  4/5 on  RLE hip ext/abd MMT to demonstrate increased LE strength and/or power needed to improve ambulation/gait mechanics.  Baseline:  Goal status: In progress   4.  Patient will improve 30 second STS test by at least 3 STS with decreased pain rating (3/10) or less to demonstrate improved activity tolerance.  Baseline: Goal status: In progress 5.   Patient will report at least a 75% improvement with function and/or radiating pain reduction overall since beginning PT. Baseline:  Goal status:  In progress  PLAN:  PT FREQUENCY: 1x/week  PT DURATION: 4 weeks  PLANNED INTERVENTIONS: 97164- PT Re-evaluation, 97110-Therapeutic exercises, 97530- Therapeutic activity, [id number]- Neuromuscular re-education, 97535- Self Care, 02859- Manual therapy, [id number]- Gait training, [phone]- Electrical stimulation (manual), [id number]- Traction (mechanical), [phone] (1-2 muscles), 20561 (3+ muscles)- Dry Needling, Patient/Family education, Balance training, Stair training, Taping, Joint mobilization, Spinal mobilization, Cryotherapy, and Moist heat.  PLAN FOR NEXT SESSION: Review HEP, cont improving LE flexibility, lumbar mobility, address posture, progress endurance training.  Begin prone planks  Lang Ada, PT, DPT [facility] Office: [phone] 9:50 AM, 09/12/2024

## 2024-09-19 ENCOUNTER — Encounter (HOSPITAL_COMMUNITY): Payer: Self-pay

## 2024-09-19 ENCOUNTER — Ambulatory Visit (HOSPITAL_COMMUNITY)

## 2024-09-19 DIAGNOSIS — M79605 Pain in left leg: Secondary | ICD-10-CM

## 2024-09-19 DIAGNOSIS — R293 Abnormal posture: Secondary | ICD-10-CM

## 2024-09-19 DIAGNOSIS — R29898 Other symptoms and signs involving the musculoskeletal system: Secondary | ICD-10-CM

## 2024-09-19 DIAGNOSIS — M5459 Other low back pain: Secondary | ICD-10-CM

## 2024-09-19 NOTE — Therapy (Signed)
 " OUTPATIENT PHYSICAL THERAPY THORACOLUMBAR TREATMENT  PHYSICAL THERAPY DISCHARGE SUMMARY  Visits from Start of Care: 7  Current functional level related to goals / functional outcomes: MET   Remaining deficits: NONE   Education / Equipment: Referral process, importance of HEP compliance   Patient agrees to discharge. Patient goals were met. Patient is being discharged due to meeting the stated rehab goals.  [name] MRN: [id number] DOB:Nov 11, 2000, 23 y.o., male Today's Date: 09/19/2024  END OF SESSION:  PT End of Session - 09/19/24 1546     Visit Number 8    Number of Visits 9    Date for Recertification  09/19/24    Authorization Type [employer]BS COMM PPO    Authorization Time Period no auth req    Progress Note Due on Visit 9    PT Start Time 1546    PT Stop Time 1625    PT Time Calculation (min) 39 min    Activity Tolerance Patient tolerated treatment well    Behavior During Therapy WFL for tasks assessed/performed                 History reviewed. No pertinent past medical history. History reviewed. No pertinent surgical history. There are no active problems to display for this patient.   PCP: Chinita Hoy LITTIE DEVONNA  REFERRING PROVIDER: Donata Snowman, PA-C  REFERRING DIAG: M54.50 (ICD-10-CM) - Low back pain, unspecified M79.605 (ICD-10-CM) - Pain in left leg M62.9 (ICD-10-CM) - Disorder of muscle, unspecified  Rationale for Evaluation and Treatment: Rehabilitation  THERAPY DIAG:  Other low back pain  Leg weakness, bilateral  Abnormal posture  Low back pain radiating to both legs  ONSET DATE: ~5 Months   SUBJECTIVE:                                                                                                                                                                                           SUBJECTIVE STATEMENT: Pt states he has no pain upon presentation. Pt states he is feeling okay with this being his last  treatment. Pt states he has been feeling pretty comfortable with the exercises.   Eval: Patient reports this last Sunday was really bad as far as pain; To the point where he was barely able to walk. Reports his pain is usually from low back and travels to posterior hip near glute region and can sometimes travel to back of knees, on both sides. As of recent, not much back pain as much as it is hip/LE pain.    PERTINENT HISTORY:  Previous knee issues due to running cross country  PAIN:  Are  you having pain? Yes: NPRS scale: 0/10 Pain location: Glute/hips bilaterally  Pain description: sharp pain  Aggravating factors: Walking, climbing on machines at work, sitting prolonged  Relieving factors: Iburpofen, advil   PRECAUTIONS: None  RED FLAGS: None   WEIGHT BEARING RESTRICTIONS: No  FALLS:  Has patient fallen in last 6 months? Yes. Number of falls 1. 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  DIAGNOSTIC FINDINGS:  N/A  PATIENT SURVEYS:  Lower Extremity Functional Score: 59 / 80 = 73.8 % LEFS 08/21/24: 71 / 80 = 88.8 %  COGNITION: Overall cognitive status: Within functional limits for tasks assessed     SENSATION: WFL  MUSCLE LENGTH: Hamstrings: Right 140 deg; Left 140 deg Tight piriformis bilaterally when put in figure 4 position  Hamstrings 08/21/24: 150 on RLE, 143 on LLE  09/12/24: RLE 157 degrees LLE: 150 degrees  POSTURE: rounded shoulders, forward head, decreased lumbar lordosis, and increased thoracic kyphosis  PALPATION: TTP in  L3-4 with CPA Tight paraspinals throughout  LUMBAR ROM:   AROM eval  Flexion Mid shin, * down BLE  Extension  50% *  Right lateral flexion To knee  Left lateral flexion To knee  Right rotation WFL *  Left rotation WFL    (Blank rows = not tested)    *=painful    LOWER EXTREMITY MMT:    MMT Right eval Left eval Right 08/21/24 Left  08/21/24 Right 12/19 Left 12/19  Hip flexion 4+ 4+ 4+ 4+    Hip extension 4- 4 4-, little pain 4-, little pain 4+ 4+  Hip abduction 4- 4 4+ 4 4+ 4+  Hip adduction        Hip internal rotation        Hip external rotation        Knee flexion        Knee extension        Ankle dorsiflexion 5 5      Ankle plantarflexion        Ankle inversion        Ankle eversion         (Blank rows = not tested)  LUMBAR SPECIAL TESTS:  Straight leg raise test: Positive   FUNCTIONAL TESTS:  30 seconds chair stand test: 11 STS, no UE support, inc pain started in RLE towards end 30 seconds chair stand test: 13 reps, no increased pain in the low back 09/19/24: 18, no pain in back or legs GAIT: Distance walked: 100 ft in session  Assistive device utilized: None Level of assistance: Complete Independence Comments: WNL at time of eval  TREATMENT DATE:  09/19/2024  Discharged note: goals reviewed, HEP revised, pt education on POC, importance of HEP compliance Therapeutic Exercise: -Elliptical, 5 minutes, level 5 resistance, pt cued for increased pace Neuromuscular Re-education: -Superman's, 2 set of 1 rep of 30 second holds, bilaterally, pt cued for increased hip elevation and proper LE/UE placement Therapeutic Activity: -Single leg Romanian dead lift, 10 lb kettle bell to 4 inch step, 1 set of 10  reps bilaterally, pt cued for core activation and neutral spine -Goblet squats, 1 set of 10 reps, 10 lb kettle bell, pt cued for ER of hips for ability to keep heels on the ground  09/12/2024  Therapeutic Exercise: -Treadmill, 5 minutes, level 2.0 grade,  speed 1.9>2.3>2.8>3.2 Neuromuscular Re-education: -Paloff press, 2 set of 10 reps, Black TB at chest level on web slide, pt cued for sequencing, second set on blue foam -Standard plank, 1 set of 60 seconds, pt cued for decreased hip elevation -Side plank, 1 set of 1 rep of 30 second holds, bilaterally, pt cued for increased hip elevation and proper LE/UE placement -Bird dog, 1 set of 10 reps, bilaterally, pt cued for increased hip ROM and neutral spine throughout movement -Dead bug, 1 set 10 reps, bilaterally, pt cued for 1 second hold, (pt reports increase in RLE pain*) Therapeutic Activity: -Kettle bell swings, 2 sets of 10 reps, 10lb kettle bell, pt cued for neutral lumbar spine -Single leg Romanian dead lift, 10 lb kettle bell to 4 inch step, 1 set of 10 reps bilaterally, pt cued for core activation and neutral spine -Sled pushes/pulls, 30lbs of plates, 4 laps, 30 feet per lap, pt cued for increased step length and increased pace as tolerated                                   08/25/24 EXERCISE LOG  Exercise Repetitions and Resistance Comments  Treadmill  2% grade @ 2.0 mph x 5 minutes    Standing hamstring stretch  2 x 30 seconds  With hip IR and ER   TFL stretch  30 seconds  RLE only   Standing HS isometric  5 reps each x 5 second hold  With neutral, IR and ER hip position   Eccentric heel tap  6 step x 2 minutes each    Standing heel raise   2 minutes  On step   Cybex HS curl   5 plates x 2 minutes    Leg press   6 plates x 2 minutes    Rebounder 3 kg @  2 x 20 reps each  SLS on foam    Blank cell = exercise not performed today   PATIENT EDUCATION:  Education details:HEP, healing, and activity modifications Person educated: Patient Education method: Explanation and Demonstration Education comprehension: verbalized understanding and returned demonstration  HOME EXERCISE PROGRAM: Access Code: [email or web address] URL: [email or web address] Date: 08/21/2024 Prepared by:  Lang Ada  Exercises - Hooklying Hamstring Stretch with Strap  - 2 x daily - 7 x weekly - 2 sets - 30 hold - Seated Hamstring Stretch  - 2 x daily - 7 x weekly - 2 sets - 30 hold - Supine Piriformis Stretch with Foot on Ground  - 2 x daily - 7 x weekly - 2 sets - 30 hold - Seated Piriformis Stretch with Trunk Bend  - 2 x daily - 7 x weekly - 2 sets - 30 hold - Lower Trunk Rotations  - 2 x daily - 7 x weekly - 2 sets - 10 reps - Supine Bridge with Resistance Band  - 1 x daily - 7 x weekly - 3 sets - 10 reps - 3 hold - Forward Monster Walks  - 1 x daily - 7 x weekly - 3 sets - 10 reps - Backward Monster Walks  - 1 x daily - 7 x weekly - 3  sets - 10 reps - Side Stepping with Resistance at Ankles  - 1 x daily - 7 x weekly - 3 sets - 10 reps - Bird Dog  - 1 x daily - 7 x weekly - 2 sets - 8 reps - 3 hold - Side Plank on Elbow  - 1 x daily - 7 x weekly - 1 sets - 3 reps - 20 hold   ASSESSMENT:  CLINICAL IMPRESSION: Patient demonstrates increased activity tolerance with no pain upon presentation. Pt continues to demonstrate improvements in LE strength, gait quality and balance; demonstrates ability to meet all initial therapy goals this date. Patient able to progress dynamic balance and core activation exercises today with walking lunges and prone super mans, good performance with verbal cueing. Patient to be discharged to independent HEP at this time due to meeting all goals.       OBJECTIVE IMPAIRMENTS: decreased activity tolerance, decreased endurance, decreased mobility, decreased ROM, decreased strength, hypomobility, impaired perceived functional ability, impaired flexibility, improper body mechanics, postural dysfunction, and pain.   ACTIVITY LIMITATIONS: carrying, lifting, bending, sitting, standing, squatting, stairs, transfers, and bed mobility  PARTICIPATION LIMITATIONS: meal prep, cleaning, laundry, driving, community activity, and occupation  PERSONAL FACTORS: N/A are also  affecting patient's functional outcome.   REHAB POTENTIAL: Good  CLINICAL DECISION MAKING: Stable/uncomplicated  EVALUATION COMPLEXITY: Low   GOALS: Goals reviewed with patient? Yes  SHORT TERM GOALS: Target date: 07/16/24 Patient will be independent with performance of HEP to demonstrate adequate self management of symptoms.  Baseline:  Goal status: MET  2.   Patient will report at least a 25% improvement with function and/or pain reduction overall since beginning PT. Baseline:  Goal status: MET   LONG TERM GOALS: Target date: 08/20/24 Patient will improve LEFS score by 9 points to demonstrate improved perceived function while meeting MCID.  Baseline: Goal status: MET 2.  Patient will improve  hamstring  ROM by at least 10 degrees to demonstrate improved LE flexibility/mobility needed for functional transfers and gait mechanics to reduce pain.  Baseline:  Goal status: MET 3.  Patient will score at least a  4/5 on  RLE hip ext/abd MMT to demonstrate increased LE strength and/or power needed to improve ambulation/gait mechanics.  Baseline:  Goal status: MET   4.  Patient will improve 30 second STS test by at least 3 STS with decreased pain rating (3/10) or less to demonstrate improved activity tolerance.  Baseline: Goal status: MET 5.   Patient will report at least a 75% improvement with function and/or radiating pain reduction overall since beginning PT. Baseline: 85-90% reported, 09/19/24 Goal status: MET  PLAN:  PT FREQUENCY: 1x/week  PT DURATION: 4 weeks  PLANNED INTERVENTIONS: 97164- PT Re-evaluation, 97110-Therapeutic exercises, 97530- Therapeutic activity, 97112- Neuromuscular re-education, 97535- Self Care, 02859- Manual therapy, [id number]- Gait training, [phone]- Electrical stimulation (manual), [id number]- Traction (mechanical), [phone] (1-2 muscles), 20561 (3+ muscles)- Dry Needling, Patient/Family education, Balance training, Stair training, Taping, Joint mobilization,  Spinal mobilization, Cryotherapy, and Moist heat.  PLAN FOR NEXT SESSION: Discharged  Lang Ada, PT, DPT [facility] Office: [phone] 4:33 PM, 09/19/2024      "
